# Patient Record
Sex: Female | Born: 2000 | Race: White | Hispanic: No | Marital: Single | Smoking: Never smoker
Health system: Southern US, Community
[De-identification: ages and names within clinical notes are randomized; demographics above are authoritative.]

## PROBLEM LIST (undated history)

## (undated) DIAGNOSIS — F32A Depression, unspecified: Secondary | ICD-10-CM

## (undated) DIAGNOSIS — F411 Generalized anxiety disorder: Secondary | ICD-10-CM

## (undated) DIAGNOSIS — D649 Anemia, unspecified: Secondary | ICD-10-CM

## (undated) DIAGNOSIS — Z7689 Persons encountering health services in other specified circumstances: Secondary | ICD-10-CM

## (undated) HISTORY — DX: Generalized anxiety disorder: F41.1

## (undated) HISTORY — DX: Depression, unspecified: F32.A

## (undated) HISTORY — DX: Persons encountering health services in other specified circumstances: Z76.89

## (undated) HISTORY — DX: Anemia, unspecified: D64.9

---
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
## 2019-09-03 ENCOUNTER — Ambulatory Visit (INDEPENDENT_AMBULATORY_CARE_PROVIDER_SITE_OTHER): Payer: No Typology Code available for payment source | Admitting: Family Medicine

## 2019-09-03 ENCOUNTER — Other Ambulatory Visit: Payer: Self-pay

## 2019-09-03 VITALS — BP 122/76 | Temp 98.5°F | Ht 66.0 in | Wt 120.8 lb

## 2019-09-03 DIAGNOSIS — Z0000 Encounter for general adult medical examination without abnormal findings: Secondary | ICD-10-CM | POA: Diagnosis not present

## 2019-09-03 DIAGNOSIS — F321 Major depressive disorder, single episode, moderate: Secondary | ICD-10-CM | POA: Diagnosis not present

## 2019-09-03 DIAGNOSIS — F411 Generalized anxiety disorder: Secondary | ICD-10-CM

## 2019-09-03 MED ORDER — ESCITALOPRAM OXALATE 10 MG PO TABS: 10.0000 mg | ORAL_TABLET | Freq: Every day | ORAL | 2 refills | Status: DC

## 2019-09-03 NOTE — Patient Instructions (Signed)
Persistent Depressive Disorder  Persistent depressive disorder (PDD) is a mental health condition. PDD causes symptoms of low-level depression for 2 years or longer. It may also be called long-term (chronic) depression or dysthymia. PDD may include episodes of more severe depression that last for about 2 weeks (major depressive disorder or MDD). PDD can affect the way you think, feel, and sleep. This condition may also affect your relationships. You may be more likely to get sick if you have PDD. Symptoms of PDD occur for most of the day and may include:  Feeling tired (fatigue).  Low energy.  Eating too much or too little.  Sleeping too much or too little.  Feeling restless or agitated.  Feeling hopeless.  Feeling worthless or guilty.  Feeling worried or nervous (anxiety).  Trouble concentrating or making decisions.  Low self-esteem.  A negative way of looking at things (outlook).  Not being able to have fun or feel pleasure.  Avoiding interacting with people.  Getting angry or annoyed easily (irritability).  Acting aggressive or angry. Follow these instructions at home: Activity  Go back to your normal activities as told by your doctor.  Exercise regularly as told by your doctor. General instructions  Take over-the-counter and prescription medicines only as told by your doctor.  Do not drink alcohol. Or, limit how much alcohol you drink to no more than 1 drink a day for nonpregnant women and 2 drinks a day for men. One drink equals 12 oz of beer, 5 oz of wine, or 1 oz of hard liquor. Alcohol can affect any antidepressant medicines you are taking. Talk with your doctor about your alcohol use.  Eat a healthy diet and get plenty of sleep.  Find activities that you enjoy each day.  Consider joining a support group. Your doctor may be able to suggest a support group.  Keep all follow-up visits as told by your doctor. This is important. Where to find more  information [employer] on Mental Illness  www.nami.org [place] National Institute of Mental Health  https://carter.com/ National Suicide Prevention Lifeline  478 293 7458).  This is free, 24-hour help. Contact a doctor if:  Your symptoms get worse.  You have new symptoms.  You have trouble sleeping or doing your daily activities. Get help right away if:  You self-harm.  You have serious thoughts about hurting yourself or others.  You see, hear, taste, smell, or feel things that are not there (hallucinate). This information is not intended to replace advice given to you by your health care provider. Make sure you discuss any questions you have with your health care provider. Document Revised: 07/21/2017 Document Reviewed: 04/01/2016 Elsevier Patient Education  [place]. Generalized Anxiety Disorder, Adult Generalized anxiety disorder (GAD) is a mental health disorder. People with this condition constantly worry about everyday events. Unlike normal anxiety, worry related to GAD is not triggered by a specific event. These worries also do not fade or get better with time. GAD interferes with life functions, including relationships, work, and school. GAD can vary from mild to severe. People with severe GAD can have intense waves of anxiety with physical symptoms (panic attacks). What are the causes? The exact cause of GAD is not known. What increases the risk? This condition is more likely to develop in:  Women.  People who have a family history of anxiety disorders.  People who are very shy.  People who experience very stressful life events, such as the death of a loved one.  People  who have a very stressful family environment. What are the signs or symptoms? People with GAD often worry excessively about many things in their lives, such as their health and family. They may also be overly concerned about:  Doing well at work.  Being on time.  Natural  disasters.  Friendships. Physical symptoms of GAD include:  Fatigue.  Muscle tension or having muscle twitches.  Trembling or feeling shaky.  Being easily startled.  Feeling like your heart is pounding or racing.  Feeling out of breath or like you cannot take a deep breath.  Having trouble falling asleep or staying asleep.  Sweating.  Nausea, diarrhea, or irritable bowel syndrome (IBS).  Headaches.  Trouble concentrating or remembering facts.  Restlessness.  Irritability. How is this diagnosed? Your health care provider can diagnose GAD based on your symptoms and medical history. You will also have a physical exam. The health care provider will ask specific questions about your symptoms, including how severe they are, when they started, and if they come and go. Your health care provider may ask you about your use of alcohol or drugs, including prescription medicines. Your health care provider may refer you to a mental health specialist for further evaluation. Your health care provider will do a thorough examination and may perform additional tests to rule out other possible causes of your symptoms. To be diagnosed with GAD, a person must have anxiety that:  Is out of his or her control.  Affects several different aspects of his or her life, such as work and relationships.  Causes distress that makes him or her unable to take part in normal activities.  Includes at least three physical symptoms of GAD, such as restlessness, fatigue, trouble concentrating, irritability, muscle tension, or sleep problems. Before your health care provider can confirm a diagnosis of GAD, these symptoms must be present more days than they are not, and they must last for six months or longer. How is this treated? The following therapies are usually used to treat GAD:  Medicine. Antidepressant medicine is usually prescribed for long-term daily control. Antianxiety medicines may be added in  severe cases, especially when panic attacks occur.  Talk therapy (psychotherapy). Certain types of talk therapy can be helpful in treating GAD by providing support, education, and guidance. Options include: ? Cognitive behavioral therapy (CBT). People learn coping skills and techniques to ease their anxiety. They learn to identify unrealistic or negative thoughts and behaviors and to replace them with positive ones. ? Acceptance and commitment therapy (ACT). This treatment teaches people how to be mindful as a way to cope with unwanted thoughts and feelings. ? Biofeedback. This process trains you to manage your body's response (physiological response) through breathing techniques and relaxation methods. You will work with a therapist while machines are used to monitor your physical symptoms.  Stress management techniques. These include yoga, meditation, and exercise. A mental health specialist can help determine which treatment is best for you. Some people see improvement with one type of therapy. However, other people require a combination of therapies. Follow these instructions at home:  Take over-the-counter and prescription medicines only as told by your health care provider.  Try to maintain a normal routine.  Try to anticipate stressful situations and allow extra time to manage them.  Practice any stress management or self-calming techniques as taught by your health care provider.  Do not punish yourself for setbacks or for not making progress.  Try to recognize your accomplishments, even if they  are small.  Keep all follow-up visits as told by your health care provider. This is important. Contact a health care provider if:  Your symptoms do not get better.  Your symptoms get worse.  You have signs of depression, such as: ? A persistently sad, cranky, or irritable mood. ? Loss of enjoyment in activities that used to bring you joy. ? Change in weight or eating. ? Changes in  sleeping habits. ? Avoiding friends or family members. ? Loss of energy for normal tasks. ? Feelings of guilt or worthlessness. Get help right away if:  You have serious thoughts about hurting yourself or others. If you ever feel like you may hurt yourself or others, or have thoughts about taking your own life, get help right away. You can go to your nearest emergency department or call:  Your local emergency services (911 in the [place]  A suicide crisis helpline, such as the National Suicide Prevention Lifeline at 703-189-4594. This is open 24 hours a day. Summary  Generalized anxiety disorder (GAD) is a mental health disorder that involves worry that is not triggered by a specific event.  People with GAD often worry excessively about many things in their lives, such as their health and family.  GAD may cause physical symptoms such as restlessness, trouble concentrating, sleep problems, frequent sweating, nausea, diarrhea, headaches, and trembling or muscle twitching.  A mental health specialist can help determine which treatment is best for you. Some people see improvement with one type of therapy. However, other people require a combination of therapies. This information is not intended to replace advice given to you by your health care provider. Make sure you discuss any questions you have with your health care provider. Document Revised: 07/21/2017 Document Reviewed: 06/28/2016 Elsevier Patient Education  2020 [employer].

## 2019-09-03 NOTE — Progress Notes (Signed)
Subjective:    Patient ID: Melissa Santana, female    DOB: 04/27/2001, 19 y.o.   MRN: [id number]  HPI   The patient comes in today for a wellness visit.    A review of their health history was completed.  A review of medications was also completed.  Any needed refills; none  Eating habits: healthy diet  Falls/  MVA accidents in past few months: none  Regular exercise: everyday  Specialist pt sees on regular basis: mental health therapist  Preventative health issues were discussed.   Additional concerns: Patient would like to start medications for depression and Anxiety- GAD 7 and PHQ 9 given to patient. Patient states the mental health facility where she receives her therapy could not get her in till the end of February for medications.   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  HENT: Negative for congestion and rhinorrhea.   Eyes: Negative for discharge.  Respiratory: Negative for cough, chest tightness and wheezing.   Cardiovascular: Negative for chest pain.  Gastrointestinal: Negative for abdominal pain, blood in stool and vomiting.  Endocrine: Negative for polyphagia.  Genitourinary: Negative for difficulty urinating and frequency.  Musculoskeletal: Negative for neck pain.  Skin: Negative for color change.    Allergic/Immunologic: Negative for environmental allergies and food allergies.  Neurological: Negative for weakness and headaches.  Psychiatric/Behavioral: Negative for agitation and behavioral problems.  All other systems reviewed and are negative.      Objective:   Physical Exam Vitals reviewed.  Constitutional:      Appearance: She is well-developed.  HENT:     Head: Normocephalic.     Right Ear: External ear normal.     Left Ear: External ear normal.  Eyes:     Pupils: Pupils are equal, round, and reactive to light.  Neck:     Thyroid: No thyromegaly.  Cardiovascular:     Rate and Rhythm: Normal rate and regular rhythm.     Heart sounds: Normal heart sounds. No murmur.  Pulmonary:     Effort: Pulmonary effort is normal. No respiratory distress.     Breath sounds: Normal breath sounds. No wheezing.  Abdominal:     General: Bowel sounds are normal. There is no distension.     Palpations: Abdomen is soft. There is no mass.     Tenderness: There is no abdominal tenderness.  Musculoskeletal:        General: No tenderness. Normal range of motion.     Cervical back: Normal range of motion.  Lymphadenopathy:     Cervical: No cervical adenopathy.  Skin:    General: Skin is warm and dry.  Neurological:     Mental Status: She  is alert and oriented to person, place, and time.     Motor: No abnormal muscle tone.  Psychiatric:        Behavior: Behavior normal.           Assessment & Plan:  Impression 1 diet discussed.  Exercise discussed.  Vaccines discussed.   2.  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  Patient has now suicidal ideation.  She has had these challenges off and on literally for years but seem to be worse in recent months.  She is interested in taking medication.  Long discussion held regarding pros and cons and benefits and potential side effects including even worsening.  Patient cautioned to notify us immediately if the  medication seems to make things worse.  We will press on with Lexapro 10 mg daily rationale discussed follow-up in a couple months.  Educational information given call us in several weeks with status

## 2019-09-18 ENCOUNTER — Encounter: Payer: Self-pay | Admitting: Family Medicine

## 2019-09-19 ENCOUNTER — Encounter: Payer: Self-pay | Admitting: Family Medicine

## 2019-11-04 ENCOUNTER — Encounter: Payer: Self-pay | Admitting: Family Medicine

## 2019-11-04 ENCOUNTER — Ambulatory Visit (INDEPENDENT_AMBULATORY_CARE_PROVIDER_SITE_OTHER): Payer: No Typology Code available for payment source | Admitting: Family Medicine

## 2019-11-04 ENCOUNTER — Other Ambulatory Visit: Payer: Self-pay

## 2019-11-04 VITALS — BP 118/76 | Temp 97.1°F | Wt 120.0 lb

## 2019-11-04 DIAGNOSIS — F321 Major depressive disorder, single episode, moderate: Secondary | ICD-10-CM

## 2019-11-04 DIAGNOSIS — F411 Generalized anxiety disorder: Secondary | ICD-10-CM

## 2019-11-04 MED ORDER — ESCITALOPRAM OXALATE 10 MG PO TABS: 10.0000 mg | ORAL_TABLET | Freq: Every day | ORAL | 5 refills | Status: DC

## 2019-11-04 NOTE — Progress Notes (Signed)
   Subjective:    Patient ID: Melissa Santana, female    DOB: Nov 05, 2000, 19 y.o.   MRN: [id number]  HPI  Patient arrives for a follow up on depression. Patient states she is doing well on the Lexapro with no problems or concerns.  Patient states Lexapro handling very well.  No obvious side effects.  Within several weeks after initiating medication no tremendous improvement.  Still receiving counseling definitely helping.  No suicidal or homicidal thoughts  Excited about going to college going forward Review of Systems See above    Objective:   Physical Exam  Alert vitals stable, NAD. Blood pressure good on repeat. HEENT normal. Lungs clear. Heart regular rate and rhythm.       Assessment & Plan:  Impression depression moderate stable.  With element of anxiety disorder under good control discussed to maintain Lexapro 6 months worth.  Follow-up plan.  Exercise encouraged.  Ongoing counseling follow-up in 6 months.  Patient may well benefit from long-term medication with strong family history on both sides and personal history of these tendencies

## 2019-12-02 ENCOUNTER — Other Ambulatory Visit: Payer: Self-pay | Admitting: Family Medicine

## 2019-12-19 DIAGNOSIS — H5213 Myopia, bilateral: Secondary | ICD-10-CM | POA: Diagnosis not present

## 2020-04-11 ENCOUNTER — Ambulatory Visit
Admission: EM | Admit: 2020-04-11 | Discharge: 2020-04-11 | Disposition: A | Discharge status: Home / Self Care | Payer: 59 | Attending: Family Medicine | Admitting: Family Medicine

## 2020-04-11 ENCOUNTER — Other Ambulatory Visit: Payer: Self-pay

## 2020-04-11 DIAGNOSIS — R5383 Other fatigue: Secondary | ICD-10-CM

## 2020-04-11 MED ORDER — ONDANSETRON 4 MG PO TBDP: 4.0000 mg | ORAL_TABLET | Freq: Three times a day (TID) | ORAL | 0 refills | Status: DC | PRN

## 2020-04-11 NOTE — ED Triage Notes (Signed)
Pt presents with increased fatigue for about 3 days, has h/o anemia

## 2020-04-11 NOTE — Discharge Instructions (Signed)
Zofran as needed for nausea, vomiting.  Try to make sure your intake of fluids to include Gatorade with electrolytes for rehydration. Covid swab testing done and testing for anemia will call you with any positive or abnormal results. Rest and go to the ER for worsening symptoms

## 2020-04-12 LAB — CBC
Hematocrit: 40.9 % (ref 34.0–46.6)
Hemoglobin: 13.2 g/dL (ref 11.1–15.9)
MCH: 28.3 pg (ref 26.6–33.0)
MCHC: 32.3 g/dL (ref 31.5–35.7)
MCV: 88 fL (ref 79–97)
Platelets: 258 10*3/uL (ref 150–450)
RBC: 4.67 x10E6/uL (ref 3.77–5.28)
RDW: 12.9 % (ref 11.7–15.4)
WBC: 7.5 10*3/uL (ref 3.4–10.8)

## 2020-04-12 LAB — NOVEL CORONAVIRUS, NAA: SARS-CoV-2, NAA: NOT DETECTED

## 2020-04-12 LAB — SARS-COV-2, NAA 2 DAY TAT

## 2020-04-14 NOTE — ED Provider Notes (Signed)
[facility]    CSN: [id number] Arrival date & time: 04/11/20  1505      History   Chief Complaint Chief Complaint  Patient presents with  . Fatigue    HPI Melissa Santana is a 19 y.o. female.   Patient is a 19 year old female presents today with fatigue and just not feeling well over the last 3 days.  History of anemia.  She has had mild headache.  No cough, chest congestion, sore throat, ear pain, fever, chills, body aches.  Having regular menstrual cycles.  Menstrual cycles are always very heavy.  Last one was 03/29/2020.  No abdominal pain, vomiting or diarrhea.  Mild nausea     Past Medical History:  Diagnosis Date  . Anemia   . Menstrual extraction 06/12/2013    Patient Active Problem List   Diagnosis Date Noted  . 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 Menorrhagia 06/12/2013  . Anemia 06/12/2013  . Menstrual extraction 06/12/2013    History reviewed. No pertinent surgical history.  OB History   No obstetric history on file.      Home Medications    Prior to Admission medications   Medication Sig Start Date End Date Taking? Authorizing Provider  escitalopram (LEXAPRO) 10 MG tablet Take 1 tablet by mouth once daily 12/02/19   Merlyn Albert, MD  ondansetron (ZOFRAN ODT) 4 MG disintegrating tablet Take 1 tablet (4 mg total) by mouth every 8 (eight) hours as needed for nausea or vomiting. 04/11/20   Janace Aris, NP    Family History History reviewed. No pertinent family history.  Social History Social History   Tobacco Use  . Smoking status: Never Smoker  . Smokeless tobacco: Never Used  Substance Use Topics  . Alcohol use: No  . Drug use: No     Allergies   Patient has no known allergies.   Review of Systems Review of Systems   Physical Exam Triage Vital Signs ED Triage Vitals  Enc Vitals Group     BP 04/11/20 1548 110/74     Pulse Rate 04/11/20 1548 95     Resp 04/11/20 1548 18     Temp 04/11/20 1548 98.4 F (36.9 C)       Temp src --      SpO2 04/11/20 1548 98 %     Weight --      Height --      Head Circumference --      Peak Flow --      Pain Score 04/11/20 1547 0     Pain Loc --      Pain Edu? --      Excl. in GC? --    No data found.  Updated Vital Signs BP 110/74   Pulse 95   Temp 98.4 F (36.9 C)   Resp 18   LMP 03/29/2020   SpO2 98%   Visual Acuity Right Eye Distance:   Left Eye Distance:   Bilateral Distance:    Right Eye Near:   Left Eye Near:    Bilateral Near:     Physical Exam Vitals and nursing note reviewed.  Constitutional:      General: She is not in acute distress.    Appearance: Normal appearance. She is ill-appearing. She is not toxic-appearing or diaphoretic.  HENT:     Head: Normocephalic.     Nose: Nose normal.     Mouth/Throat:     Pharynx: Oropharynx is clear.  Eyes:  Conjunctiva/sclera: Conjunctivae normal.  Cardiovascular:     Rate and Rhythm: Normal rate and regular rhythm.  Pulmonary:     Effort: Pulmonary effort is normal.  Musculoskeletal:        General: Normal range of motion.     Cervical back: Normal range of motion.  Skin:    General: Skin is warm and dry.     Findings: No rash.  Neurological:     Mental Status: She is alert.  Psychiatric:        Mood and Affect: Mood normal.      UC Treatments / Results  Labs (all labs ordered are listed, but only abnormal results are displayed) Labs Reviewed  NOVEL CORONAVIRUS, NAA   Narrative:    Performed at:  [address], [place], [place]  [id number] Lab Director: Jolene Schimke MD, Phone:  [phone]  SARS-COV-2, NAA 2 DAY TAT   Narrative:    Performed at:  [address], [place], [place]  [id number] Lab Director: Jolene Schimke MD, Phone:  [phone]  CBC   Narrative:    Performed at:  [address], [place], [place]  [id number] Lab Director: Jolene Schimke MD, Phone:  [phone]     EKG   Radiology No results found.  Procedures Procedures (including critical care time)  Medications Ordered in UC Medications - No data to display  Initial Impression / Assessment and Plan / UC Course  I have reviewed the triage vital signs and the nursing notes.  Pertinent labs & imaging results that were available during my care of the patient were reviewed by me and considered in my medical decision making (see chart for details).     Fatigue Some concern for anemia at this time.  Will check CBC. Otherwise Ms. likely some sort of viral illness.  Covid swab pending.  Zofran as needed for nausea, vomiting. Recommend increase fluids to include Gatorade with electrolytes for rehydration. Rest and go to the ER for worsening symptoms  CBC normal Covid swab negative Final Clinical Impressions(s) / UC Diagnoses   Final diagnoses:  Fatigue, unspecified type     Discharge Instructions     Zofran as needed for nausea, vomiting.  Try to make sure your intake of fluids to include Gatorade with electrolytes for rehydration. Covid swab testing done and testing for anemia will call you with any positive or abnormal results. Rest and go to the ER for worsening symptoms    ED Prescriptions    Medication Sig Dispense Auth. Provider   ondansetron (ZOFRAN ODT) 4 MG disintegrating tablet Take 1 tablet (4 mg total) by mouth every 8 (eight) hours as needed for nausea or vomiting. 20 tablet Dahlia Byes A, NP     PDMP not reviewed this encounter.   Janace Aris, NP 04/14/20 [phone]

## 2020-04-20 ENCOUNTER — Encounter: Payer: Self-pay | Admitting: Family Medicine

## 2020-04-20 ENCOUNTER — Ambulatory Visit (INDEPENDENT_AMBULATORY_CARE_PROVIDER_SITE_OTHER): Payer: Self-pay | Admitting: Family Medicine

## 2020-04-20 ENCOUNTER — Other Ambulatory Visit: Payer: Self-pay

## 2020-04-20 VITALS — BP 102/72 | HR 115 | Temp 97.3°F | Ht 66.0 in | Wt 120.4 lb

## 2020-04-20 DIAGNOSIS — R1032 Left lower quadrant pain: Secondary | ICD-10-CM

## 2020-04-20 DIAGNOSIS — F321 Major depressive disorder, single episode, moderate: Secondary | ICD-10-CM | POA: Insufficient documentation

## 2020-04-20 MED ORDER — ESCITALOPRAM OXALATE 10 MG PO TABS: ORAL_TABLET | ORAL | 1 refills | Status: DC

## 2020-04-20 NOTE — Progress Notes (Signed)
Patient ID: Melissa Santana, female    DOB: 08-24-2000, 19 y.o.   MRN: [id number]   No chief complaint on file.  Subjective:    HPI Pt having abdominal pain. Not sure if Lexapro is causing abdominal pain. No appetite. Feeling fatigued and out of it. Pt states not sure if mental health could be contributing to issue. Pt also has had some bouts of constipation. Nothing taken for abdominal pain or constipation.     Medical History Melissa Santana has a past medical history of Anemia and Menstrual extraction (06/12/2013).   Outpatient Encounter Medications as of 04/20/2020  Medication Sig  . ondansetron (ZOFRAN ODT) 4 MG disintegrating tablet Take 1 tablet (4 mg total) by mouth every 8 (eight) hours as needed for nausea or vomiting.  . [DISCONTINUED] escitalopram (LEXAPRO) 10 MG tablet Take 1 tablet by mouth once daily  . escitalopram (LEXAPRO) 10 MG tablet Take 1.5 tablets daily   No facility-administered encounter medications on file as of 04/20/2020.     Review of Systems  Constitutional: Positive for appetite change. Negative for chills and fever.  HENT: Negative.   Eyes: Negative.   Respiratory: Negative.   Cardiovascular: Negative.   Gastrointestinal: Negative.   Endocrine: Negative.   Genitourinary: Negative.   Neurological: Negative.   Hematological: Negative.   Psychiatric/Behavioral: Negative for suicidal ideas.       Wonders if abdominal pain is related to depression.     Vitals BP 102/72   Pulse (!) 115   Temp (!) 97.3 F (36.3 C)   Ht 5\' 6"  (1.676 m)   Wt 120 lb 6.4 oz (54.6 kg)   LMP 03/29/2020   SpO2 99%   BMI 19.43 kg/m   Objective:   Physical Exam Vitals and nursing note reviewed.  Constitutional:      General: She is not in acute distress. Cardiovascular:     Rate and Rhythm: Normal rate and regular rhythm.     Heart sounds: Normal heart sounds.  Pulmonary:     Effort: Pulmonary effort is normal.     Breath sounds: Normal breath sounds.    Abdominal:     General: Abdomen is flat. Bowel sounds are normal.     Tenderness: There is abdominal tenderness.     Comments: Tenderness in LUQ and LLQ  Skin:    General: Skin is warm and dry.  Neurological:     Mental Status: She is alert and oriented to person, place, and time.  Psychiatric:        Mood and Affect: Mood normal.        Behavior: Behavior normal.     Comments: Denies suicidal ideations.       Assessment and Plan   1. Depression, major, single episode, moderate (HCC) - escitalopram (LEXAPRO) 10 MG tablet; Take 1.5 tablets daily  Dispense: 45 tablet; Refill: 1  2. Left lower quadrant abdominal pain - Ambulatory referral to Gastroenterology - 05/29/2020 Abdomen Complete   Melissa Santana presents today with c/o of generalized abdominal pain, decreased appetite, mental fogginess,nausea, loose stools that are less frequent that normal. This is not a new problem and wonders if it could be related to depression. Her PHQ-9: 14 today. I will increase her Lexapro to 15 mg and reassess for effectiveness in one month.  Upon physical exam, she did have some abdominal tenderness. Due to this finding, I ordered abdominal ultrasound and GI specialist referral. She does endorse a history of heavy menses. GYN referral may be needed.  Will notify her once ultrasound results are back and we can discuss next steps.  Agrees with plan of care discussed today. Understands warning signs to seek further care: fever, increased abdominal pain, suicidal thoughts.  Understands to follow-up in one month, sooner if anything changes.   She denies sexual activity currently or in the past.    Melissa Olive, NP 04/20/2020

## 2020-04-20 NOTE — Patient Instructions (Signed)

## 2020-04-21 ENCOUNTER — Ambulatory Visit (HOSPITAL_COMMUNITY)
Admission: RE | Admit: 2020-04-21 | Discharge: 2020-04-21 | Disposition: A | Discharge status: Home / Self Care | Payer: Self-pay | Source: Ambulatory Visit | Attending: Family Medicine | Admitting: Family Medicine

## 2020-04-21 DIAGNOSIS — R1032 Left lower quadrant pain: Secondary | ICD-10-CM | POA: Insufficient documentation

## 2020-04-22 ENCOUNTER — Encounter: Payer: Self-pay | Admitting: Internal Medicine

## 2020-04-23 ENCOUNTER — Telehealth: Payer: Self-pay | Admitting: Family Medicine

## 2020-04-23 NOTE — Telephone Encounter (Signed)
Left message to return call with pt to get more information

## 2020-04-23 NOTE — Telephone Encounter (Signed)
Pt is still having problems. Grandmother feels mental part is what bothers her. She is feeling out of it, dry mouth , loss of appetite, depression and anxiety, dizziness foggy memory. Grandmother feels it may be her meds. Pt is no longer in therapy each visit she was getting same advice and felt it not being beneficial.   Pt call back [phone]

## 2020-04-29 NOTE — Progress Notes (Signed)
Left message to return call 

## 2020-05-07 ENCOUNTER — Other Ambulatory Visit: Payer: Self-pay

## 2020-05-08 ENCOUNTER — Other Ambulatory Visit: Payer: Self-pay

## 2020-05-08 ENCOUNTER — Encounter: Payer: Self-pay | Admitting: Family Medicine

## 2020-05-08 ENCOUNTER — Ambulatory Visit: Payer: Self-pay | Admitting: Family Medicine

## 2020-05-08 ENCOUNTER — Telehealth: Payer: Self-pay | Admitting: *Deleted

## 2020-05-08 ENCOUNTER — Telehealth (INDEPENDENT_AMBULATORY_CARE_PROVIDER_SITE_OTHER): Payer: Self-pay | Admitting: Family Medicine

## 2020-05-08 DIAGNOSIS — F411 Generalized anxiety disorder: Secondary | ICD-10-CM | POA: Insufficient documentation

## 2020-05-08 DIAGNOSIS — F321 Major depressive disorder, single episode, moderate: Secondary | ICD-10-CM

## 2020-05-08 DIAGNOSIS — Z20822 Contact with and (suspected) exposure to covid-19: Secondary | ICD-10-CM

## 2020-05-08 MED ORDER — SERTRALINE HCL 50 MG PO TABS: 50.0000 mg | ORAL_TABLET | Freq: Every day | ORAL | 1 refills | Status: DC

## 2020-05-08 NOTE — Telephone Encounter (Signed)
Ms. zanovia, rotz are scheduled for a virtual visit with your provider today.    Just as we do with appointments in the office, we must obtain your consent to participate.  Your consent will be active for this visit and any virtual visit you may have with one of our providers in the next 365 days.    If you have a MyChart account, I can also send a copy of this consent to you electronically.  All virtual visits are billed to your insurance company just like a traditional visit in the office.  As this is a virtual visit, video technology does not allow for your provider to perform a traditional examination.  This may limit your provider's ability to fully assess your condition.  If your provider identifies any concerns that need to be evaluated in person or the need to arrange testing such as labs, EKG, etc, we will make arrangements to do so.    Although advances in technology are sophisticated, we cannot ensure that it will always work on either your end or our end.  If the connection with a video visit is poor, we may have to switch to a telephone visit.  With either a video or telephone visit, we are not always able to ensure that we have a secure connection.   I need to obtain your verbal consent now.   Are you willing to proceed with your visit today?   Marketta ALIANIS TRIMMER has provided verbal consent on 05/08/2020 for a virtual visit (video or telephone).   Haze Rushing, LPN 0/04/3817  2:99 PM

## 2020-05-08 NOTE — Progress Notes (Signed)
   Patient ID: Melissa Santana, female    DOB: 12/20/2000, 19 y.o.   MRN: [id number]   Chief Complaint  Patient presents with  . Depression    Patient reports Lexapro not working as well as it use to.    Subjective:  CC: follow-up for depression and anxiety.  HPI Saw Melissa Santana on 8/30 and Lexapro increased due to not feeling it was working as well. Reports she still does not feel better and wishes to try a different medication. Denies thoughts of harming self or others.   Medical History Melissa Santana has a past medical history of Anemia and Menstrual extraction (06/12/2013).   Outpatient Encounter Medications as of 05/08/2020  Medication Sig  . sertraline (ZOLOFT) 50 MG tablet Take 1 tablet (50 mg total) by mouth daily.  . [DISCONTINUED] escitalopram (LEXAPRO) 10 MG tablet Take 1.5 tablets daily  . [DISCONTINUED] ondansetron (ZOFRAN ODT) 4 MG disintegrating tablet Take 1 tablet (4 mg total) by mouth every 8 (eight) hours as needed for nausea or vomiting.   No facility-administered encounter medications on file as of 05/08/2020.     Review of Systems  Psychiatric/Behavioral: Negative for self-injury and suicidal ideas.       Feels like Lexapro is no longer working.      Vitals There were no vitals taken for this visit. unable to perform. This is a phone visit.   Objective:   Physical Exam Unable to preform.   Assessment and Plan   1. Depression, major, single episode, moderate (HCC) - sertraline (ZOLOFT) 50 MG tablet; Take 1 tablet (50 mg total) by mouth daily.  Dispense: 30 tablet; Refill: 1  2. Generalized anxiety disorder - sertraline (ZOLOFT) 50 MG tablet; Take 1 tablet (50 mg total) by mouth daily.  Dispense: 30 tablet; Refill: 1   Increased Melissa Santana's Lexapro on 8/30. She reports it still is not working as well for her as in the past. She wishes to try a different drug. She will start Sertraline 50 mg today and follow-up in 6 weeks, sooner if needed.  Agrees with plan  of care discussed today. Understands warning signs to seek further care: feelings or thoughts of harming self or others. If the change in therapy is not working at all.  Understands to follow-up in 6 weeks, sooner if needed. She understands.        Virtual Visit via Video Note  I connected with Melissa Santana on 05/08/20 at  1:30 PM EDT by a video enabled telemedicine application and verified that I am speaking with the correct person using two identifiers.  Location: Patient: home Provider: office    I discussed the limitations of evaluation and management by telemedicine and the availability of in person appointments. The patient expressed understanding and agreed to proceed.  History of Present Illness:    Observations/Objective:   Assessment and Plan:   Follow Up Instructions:    I discussed the assessment and treatment plan with the patient. The patient was provided an opportunity to ask questions and all were answered. The patient agreed with the plan and demonstrated an understanding of the instructions.   The patient was advised to call back or seek an in-person evaluation if the symptoms worsen or if the condition fails to improve as anticipated.  I provided 15 minutes of non-face-to-face time during this encounter.

## 2020-05-11 LAB — NOVEL CORONAVIRUS, NAA: SARS-CoV-2, NAA: NOT DETECTED

## 2020-05-20 ENCOUNTER — Ambulatory Visit: Payer: Self-pay | Admitting: Family Medicine

## 2020-06-05 ENCOUNTER — Ambulatory Visit: Payer: Self-pay | Admitting: Nurse Practitioner

## 2020-06-11 ENCOUNTER — Encounter: Payer: Self-pay | Admitting: Gastroenterology

## 2020-06-11 NOTE — Progress Notes (Deleted)
Referring Provider: Novella Olive, NP Primary Care Physician:  Annalee Genta, DO Primary Gastroenterologist:  Dr. Jena Gauss  No chief complaint on file.   HPI:   Melissa Santana is a 19 y.o. female presenting today at the request of Novella Olive, NP for LLQ abdominal pain.   Office visit with PCP 04/20/2020 with chief complaint of abdominal pain.  Taking Lexapro for depression and was not sure if this was causing abdominal pain.  No appetite, felt fatigued, bouts of constipation.  Patient stated she was not sure if mental health could be contributing to symptoms.  Lexapro was increased from 10 to 15 mg.  She was scheduled for ultrasound of her abdomen and referred to GI.  Abdominal ultrasound 04/21/2020: 2 mm gallbladder polyp, otherwise unremarkable.   Televisit with PCP 05/08/2020 for ongoing depression with patient desiring to try different medication.  Lexapro was stopped.  She was started on Zoloft 50 mg daily.  Today:     Gallbladder polyp: [place] in 1 year.   Consider pelvic US first  Past Medical History:  Diagnosis Date  . Anemia   . Menstrual extraction 06/12/2013    No past surgical history on file.  Current Outpatient Medications  Medication Sig Dispense Refill  . sertraline (ZOLOFT) 50 MG tablet Take 1 tablet (50 mg total) by mouth daily. 30 tablet 1   No current facility-administered medications for this visit.    Allergies as of 06/12/2020  . (No Known Allergies)    No family history on file.  Social History   Socioeconomic History  . Marital status: Single    Spouse name: Not on file  . Number of children: Not on file  . Years of education: Not on file  . Highest education level: Not on file  Occupational History  . Not on file  Tobacco Use  . Smoking status: Never Smoker  . Smokeless tobacco: Never Used  Substance and Sexual Activity  . Alcohol use: No  . Drug use: No  . Sexual activity: Never  Other Topics Concern  . Not on file    Social History Narrative  . Not on file   Social Determinants of Health   Financial Resource Strain:   . Difficulty of Paying Living Expenses: Not on file  Food Insecurity:   . Worried About Programme researcher, broadcasting/film/video in the Last Year: Not on file  . Ran Out of Food in the Last Year: Not on file  Transportation Needs:   . Lack of Transportation (Medical): Not on file  . Lack of Transportation (Non-Medical): Not on file  Physical Activity:   . Days of Exercise per Week: Not on file  . Minutes of Exercise per Session: Not on file  Stress:   . Feeling of Stress : Not on file  Social Connections:   . Frequency of Communication with Friends and Family: Not on file  . Frequency of Social Gatherings with Friends and Family: Not on file  . Attends Religious Services: Not on file  . Active Member of Clubs or Organizations: Not on file  . Attends Banker Meetings: Not on file  . Marital Status: Not on file  Intimate Partner Violence:   . Fear of Current or Ex-Partner: Not on file  . Emotionally Abused: Not on file  . Physically Abused: Not on file  . Sexually Abused: Not on file    Review of Systems: Gen: Denies any fever, chills, fatigue, weight loss, lack  of appetite.  CV: Denies chest pain, heart palpitations, peripheral edema, syncope.  Resp: Denies shortness of breath at rest or with exertion. Denies wheezing or cough.  GI: Denies dysphagia or odynophagia. Denies jaundice, hematemesis, fecal incontinence. GU : Denies urinary burning, urinary frequency, urinary hesitancy MS: Denies joint pain, muscle weakness, cramps, or limitation of movement.  Derm: Denies rash, itching, dry skin Psych: Denies depression, anxiety, memory loss, and confusion Heme: Denies bruising, bleeding, and enlarged lymph nodes.  Physical Exam: There were no vitals taken for this visit. General:   Alert and oriented. Pleasant and cooperative. Well-nourished and well-developed.  Head:   Normocephalic and atraumatic. Eyes:  Without icterus, sclera clear and conjunctiva pink.  Ears:  Normal auditory acuity. Nose:  No deformity, discharge,  or lesions. Mouth:  No deformity or lesions, oral mucosa pink.  Neck:  Supple, without mass or thyromegaly. Lungs:  Clear to auscultation bilaterally. No wheezes, rales, or rhonchi. No distress.  Heart:  S1, S2 present without murmurs appreciated.  Abdomen:  +BS, soft, non-tender and non-distended. No HSM noted. No guarding or rebound. No masses appreciated.  Rectal:  Deferred  Msk:  Symmetrical without gross deformities. Normal posture. Pulses:  Normal pulses noted. Extremities:  Without clubbing or edema. Neurologic:  Alert and  oriented x4;  grossly normal neurologically. Skin:  Intact without significant lesions or rashes. Cervical Nodes:  No significant cervical adenopathy. Psych:  Alert and cooperative. Normal mood and affect.

## 2020-06-12 ENCOUNTER — Ambulatory Visit: Payer: Self-pay | Admitting: Gastroenterology

## 2020-06-15 ENCOUNTER — Ambulatory Visit: Payer: Self-pay | Admitting: Family Medicine

## 2020-06-19 ENCOUNTER — Ambulatory Visit (INDEPENDENT_AMBULATORY_CARE_PROVIDER_SITE_OTHER): Payer: Self-pay | Admitting: Family Medicine

## 2020-06-19 ENCOUNTER — Other Ambulatory Visit: Payer: Self-pay

## 2020-06-19 ENCOUNTER — Encounter: Payer: Self-pay | Admitting: Family Medicine

## 2020-06-19 VITALS — BP 110/72 | HR 84 | Temp 97.3°F | Ht 66.0 in | Wt 120.0 lb

## 2020-06-19 DIAGNOSIS — F321 Major depressive disorder, single episode, moderate: Secondary | ICD-10-CM

## 2020-06-19 DIAGNOSIS — F411 Generalized anxiety disorder: Secondary | ICD-10-CM

## 2020-06-19 MED ORDER — HYDROXYZINE HCL 10 MG PO TABS: 10.0000 mg | ORAL_TABLET | Freq: Three times a day (TID) | ORAL | 0 refills | Status: DC | PRN

## 2020-06-19 MED ORDER — ESCITALOPRAM OXALATE 20 MG PO TABS: ORAL_TABLET | ORAL | 1 refills | Status: DC

## 2020-06-19 NOTE — Progress Notes (Signed)
Patient ID: Melissa Santana, female    DOB: 12/07/2000, 19 y.o.   MRN: [id number]   Chief Complaint  Patient presents with  . Depression   Subjective:    HPI  follow up on depression and anxiety. Pt states she stopped taking zoloft due to side effects.  Was on lexapro for 8 months.  Then went onto zoloft.  Side effects with zoloft, were fatigue and nausea. Pt stopped the medication a few wks ago. Pt didn't feel like helping with depression/anxiety.  Pt stating isn't sure if going back on lexapro. Before she was on medication felt anxious and had gi issues even before meds.  No concerns of self harm. High risk for GAD, family history of anxiety and depression. In btw jobs right now.  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  Dec appetite and things don't taste good. Intermittent with sleep difficulties.  occ hard to get to sleep.   Medical History Niya has a past medical history of Anemia, Depression, GAD (generalized anxiety disorder), and Menstrual extraction (06/12/2013).   Outpatient Encounter Medications as of 06/19/2020  Medication Sig  . escitalopram (LEXAPRO) 20 MG tablet Take 1/2 tab p.o. for 7 days, then increase to 1 tab daily.  . hydrOXYzine (ATARAX/VISTARIL) 10 MG tablet Take 1 tablet (10 mg total) by mouth 3 (three) times daily as needed for anxiety.  . [DISCONTINUED] sertraline (ZOLOFT) 50 MG tablet Take 1 tablet (50 mg total) by mouth daily.   No facility-administered encounter medications on file as of 06/19/2020.     Review of Systems  Constitutional: Negative for chills and fever.  HENT: Negative for congestion, rhinorrhea and sore throat.   Respiratory: Negative for cough, shortness of breath and wheezing.   Cardiovascular: Negative for chest pain and leg swelling.  Gastrointestinal: Negative for abdominal pain, diarrhea, nausea and vomiting.  Genitourinary: Negative for dysuria and frequency.  Musculoskeletal: Negative for  arthralgias and back pain.  Skin: Negative for rash.  Neurological: Negative for dizziness, weakness and headaches.  Psychiatric/Behavioral: Positive for dysphoric mood and sleep disturbance. Negative for agitation, behavioral problems, decreased concentration, self-injury and suicidal ideas. The patient is nervous/anxious.      Vitals BP 110/72   Pulse 84   Temp (!) 97.3 F (36.3 C)   Ht 5\' 6"  (1.676 m)   Wt 120 lb (54.4 kg)   SpO2 98%   BMI 19.37 kg/m   Objective:   Physical Exam Vitals and nursing note reviewed.  Constitutional:      General: She is not in acute distress.    Appearance: Normal appearance.  HENT:     Head: Normocephalic and atraumatic.  Cardiovascular:     Rate and Rhythm: Normal rate and regular rhythm.     Pulses: Normal pulses.     Heart sounds: Normal heart sounds.  Pulmonary:     Effort: Pulmonary effort is normal.     Breath sounds: Normal breath sounds. No wheezing, rhonchi or rales.  Musculoskeletal:        General: Normal range of motion.     Right lower leg: No edema.     Left lower leg: No edema.  Skin:    General: Skin is warm and dry.     Findings: No lesion or rash.  Neurological:     General: No focal deficit present.     Mental Status: She is alert and oriented to person, place, and time.     Cranial Nerves: No cranial nerve  deficit.  Psychiatric:        Behavior: Behavior normal.        Thought Content: Thought content normal.        Judgment: Judgment normal.     Comments: +anxious mood      Assessment and Plan   1. Depression, major, single episode, moderate (HCC) - escitalopram (LEXAPRO) 20 MG tablet; Take 1/2 tab p.o. for 7 days, then increase to 1 tab daily.  Dispense: 30 tablet; Refill: 1  2. Generalized anxiety disorder - escitalopram (LEXAPRO) 20 MG tablet; Take 1/2 tab p.o. for 7 days, then increase to 1 tab daily.  Dispense: 30 tablet; Refill: 1 - hydrOXYzine (ATARAX/VISTARIL) 10 MG tablet; Take 1 tablet (10 mg  total) by mouth 3 (three) times daily as needed for anxiety.  Dispense: 30 tablet; Refill: 0    Recommending starting with 10mg  lexapro, since had less side effects and felt this medication did help.  Then after 1 wk increase to 20mg  daily. Gave hydroxyzine for anxiety and insomnia prn. Pt to call or rto if having any concerns of SI/HI, or self harm or side effects.  Pt in agreement.  F/u 4 wks for recheck.

## 2020-06-28 ENCOUNTER — Encounter: Payer: Self-pay | Admitting: Family Medicine

## 2020-07-20 ENCOUNTER — Telehealth: Payer: Self-pay | Admitting: Family Medicine

## 2020-07-23 ENCOUNTER — Encounter: Payer: Self-pay | Admitting: Family Medicine

## 2020-07-23 ENCOUNTER — Telehealth (INDEPENDENT_AMBULATORY_CARE_PROVIDER_SITE_OTHER): Payer: Self-pay | Admitting: Family Medicine

## 2020-07-23 ENCOUNTER — Other Ambulatory Visit: Payer: Self-pay

## 2020-07-23 DIAGNOSIS — F411 Generalized anxiety disorder: Secondary | ICD-10-CM

## 2020-07-23 DIAGNOSIS — F321 Major depressive disorder, single episode, moderate: Secondary | ICD-10-CM

## 2020-07-23 MED ORDER — ESCITALOPRAM OXALATE 20 MG PO TABS: 20.0000 mg | ORAL_TABLET | Freq: Every day | ORAL | 2 refills | Status: DC

## 2020-07-23 MED ORDER — HYDROXYZINE HCL 10 MG PO TABS: 10.0000 mg | ORAL_TABLET | Freq: Three times a day (TID) | ORAL | 2 refills | Status: DC | PRN

## 2020-07-23 NOTE — Progress Notes (Signed)
Patient ID: Melissa Santana, female    DOB: 06/06/2001, 19 y.o.   MRN: [id number]  Virtual Visit via Telephone Note  I connected with Melissa Santana on 07/27/20 at  2:10 PM EST by telephone and verified that I am speaking with the correct person using two identifiers.  Location: Patient: home Provider: office   I discussed the limitations, risks, security and privacy concerns of performing an evaluation and management service by telephone and the availability of in person appointments. I also discussed with the patient that there may be a patient responsible charge related to this service. The patient expressed understanding and agreed to proceed.   Chief Complaint  Patient presents with  . Anxiety and depression    Patient reports improvement in symptoms since starting lexapro. Patient states she has been sleeping well and appetite has improved.   Subjective:    HPI  F/u depression/anxiety- Had a phone visit.  Pt doing well. And feeling a "huge" diffence with 20mg  lexapro.  Taking hydroxyzine for sleep working well.  No side effects.  Sleeping well and appetite has improved.   No gi upset, dizziness, or headaches.  Medical History Yarelli has a past medical history of Anemia, Depression, GAD (generalized anxiety disorder), and Menstrual extraction (06/12/2013).   Outpatient Encounter Medications as of 07/23/2020  Medication Sig  . escitalopram (LEXAPRO) 20 MG tablet Take 1 tablet (20 mg total) by mouth daily. Take 1/2 tab p.o. for 7 days, then increase to 1 tab daily.  . hydrOXYzine (ATARAX/VISTARIL) 10 MG tablet Take 1 tablet (10 mg total) by mouth 3 (three) times daily as needed for anxiety.  . [DISCONTINUED] escitalopram (LEXAPRO) 20 MG tablet Take 1/2 tab p.o. for 7 days, then increase to 1 tab daily.  . [DISCONTINUED] hydrOXYzine (ATARAX/VISTARIL) 10 MG tablet Take 1 tablet (10 mg total) by mouth 3 (three) times daily as needed for anxiety.   No  facility-administered encounter medications on file as of 07/23/2020.     Review of Systems  Constitutional: Negative for chills and fever.  HENT: Negative for congestion, rhinorrhea and sore throat.   Respiratory: Negative for cough, shortness of breath and wheezing.   Cardiovascular: Negative for chest pain and leg swelling.  Gastrointestinal: Negative for abdominal pain, diarrhea, nausea and vomiting.  Genitourinary: Negative for dysuria and frequency.  Musculoskeletal: Negative for arthralgias and back pain.  Skin: Negative for rash.  Neurological: Negative for dizziness, weakness and headaches.  Psychiatric/Behavioral: Negative for dysphoric mood, self-injury, sleep disturbance and suicidal ideas. The patient is not nervous/anxious.      Vitals There were no vitals taken for this visit.  Objective:   Physical Exam No PE due to phone visit.  Assessment and Plan   1. Depression, major, single episode, moderate (HCC) - escitalopram (LEXAPRO) 20 MG tablet; Take 1 tablet (20 mg total) by mouth daily. Take 1/2 tab p.o. for 7 days, then increase to 1 tab daily.  Dispense: 30 tablet; Refill: 2  2. Generalized anxiety disorder - escitalopram (LEXAPRO) 20 MG tablet; Take 1 tablet (20 mg total) by mouth daily. Take 1/2 tab p.o. for 7 days, then increase to 1 tab daily.  Dispense: 30 tablet; Refill: 2 - hydrOXYzine (ATARAX/VISTARIL) 10 MG tablet; Take 1 tablet (10 mg total) by mouth 3 (three) times daily as needed for anxiety.  Dispense: 60 tablet; Refill: 2    Will cont meds for 20mg  lexapro and hydroxyzine prn. Pt doing well and feeling better.   F/u 91mo.  Follow Up Instructions:    I discussed the assessment and treatment plan with the patient. The patient was provided an opportunity to ask questions and all were answered. The patient agreed with the plan and demonstrated an understanding of the instructions.   The patient was advised to call back or seek an in-person  evaluation if the symptoms worsen or if the condition fails to improve as anticipated.  I provided 11 minutes of non-face-to-face time during this encounter.

## 2020-09-22 DIAGNOSIS — Z419 Encounter for procedure for purposes other than remedying health state, unspecified: Secondary | ICD-10-CM | POA: Diagnosis not present

## 2020-10-06 ENCOUNTER — Other Ambulatory Visit: Payer: Self-pay

## 2020-10-13 ENCOUNTER — Other Ambulatory Visit: Payer: Medicaid Other

## 2020-10-16 ENCOUNTER — Other Ambulatory Visit: Payer: Self-pay

## 2020-10-16 ENCOUNTER — Ambulatory Visit (INDEPENDENT_AMBULATORY_CARE_PROVIDER_SITE_OTHER): Payer: Medicaid Other | Admitting: Family Medicine

## 2020-10-16 VITALS — BP 116/78 | HR 103 | Temp 97.7°F | Ht 66.0 in | Wt 127.2 lb

## 2020-10-16 DIAGNOSIS — F321 Major depressive disorder, single episode, moderate: Secondary | ICD-10-CM

## 2020-10-16 DIAGNOSIS — F411 Generalized anxiety disorder: Secondary | ICD-10-CM

## 2020-10-16 MED ORDER — ESCITALOPRAM OXALATE 10 MG PO TABS: ORAL_TABLET | ORAL | 0 refills | Status: DC

## 2020-10-16 MED ORDER — CLONAZEPAM 0.5 MG PO TABS: 0.5000 mg | ORAL_TABLET | Freq: Two times a day (BID) | ORAL | 0 refills | Status: DC | PRN

## 2020-10-16 MED ORDER — FLUOXETINE HCL 20 MG PO CAPS: 20.0000 mg | ORAL_CAPSULE | Freq: Every day | ORAL | 1 refills | Status: DC

## 2020-10-16 NOTE — Patient Instructions (Signed)
Cut lexapro in 1/2 for 7 days, then take 1/2 tablet every other day.   Then start prozac 20mg  daily.  Clonazepam take 1 tab 2x per day as needed for anxiety.

## 2020-10-16 NOTE — Progress Notes (Signed)
Patient ID: Melissa Santana, female    DOB: 04-03-2001, 19 y.o.   MRN: [id number]   Chief Complaint  Patient presents with  . Depression    Follow up  . Anxiety    Follow up   Subjective:   CC- "Haven't noticed a difference when taking the atarax and the lexapro is not completely working either."  HPI Was on 10mg  lexapro then went up to 20mg  lexapro.  Not noticing working as well.  Inc in October on the lexapro.  Helping at first then wearing off.   Tried hydroxyzine 10mg .  Didn't notice much.  Tried at night and during day.   Going to school full-time.  Applying from [employer] to [employer]. Psychology-major  Feels equally depression and anxiety not controlled.  Not tearful but having low energy, dec appetite, and not feeling pleasure if doing things.  Anxious and worrying frequently.    Medical History Melissa Santana has a past medical history of Anemia, Depression, GAD (generalized anxiety disorder), and Menstrual extraction (06/12/2013).   Outpatient Encounter Medications as of 10/16/2020  Medication Sig  . clonazePAM (KLONOPIN) 0.5 MG tablet Take 1 tablet (0.5 mg total) by mouth 2 (two) times daily as needed for anxiety.  escitalopram (LEXAPRO) 10 MG tablet Take 1/2 tab p.o. daily for 7 days, then take 1/2 tab every other day for 3 doses. Then discontinue.  06/14/2013 FLUoxetine (PROZAC) 20 MG capsule Take 1 capsule (20 mg total) by mouth daily.  . hydrOXYzine (ATARAX/VISTARIL) 10 MG tablet Take 1 tablet (10 mg total) by mouth 3 (three) times daily as needed for anxiety.  . [DISCONTINUED] escitalopram (LEXAPRO) 20 MG tablet Take 1 tablet (20 mg total) by mouth daily. Take 1/2 tab p.o. for 7 days, then increase to 1 tab daily.   No facility-administered encounter medications on file as of 10/16/2020.     Review of Systems  Constitutional: Negative for chills and fever.  HENT: Negative for congestion, rhinorrhea and sore throat.   Respiratory: Negative for cough, shortness of breath and  wheezing.   Cardiovascular: Negative for chest pain and leg swelling.  Gastrointestinal: Negative for abdominal pain, diarrhea, nausea and vomiting.  Genitourinary: Negative for dysuria and frequency.  Musculoskeletal: Negative for arthralgias and back pain.  Skin: Negative for rash.  Neurological: Negative for dizziness, weakness and headaches.  Psychiatric/Behavioral: Positive for dysphoric mood. Negative for self-injury, sleep disturbance and suicidal ideas. The patient is nervous/anxious.      Vitals BP 116/78   Pulse (!) 103   Temp 97.7 F (36.5 C) (Oral)   Ht 5\' 6"  (1.676 m)   Wt 127 lb 3.2 oz (57.7 kg)   SpO2 100%   BMI 20.53 kg/m   Objective:   Physical Exam Vitals and nursing note reviewed.  Constitutional:      General: She is not in acute distress.    Appearance: Normal appearance.  HENT:     Head: Normocephalic and atraumatic.  Cardiovascular:     Rate and Rhythm: Normal rate and regular rhythm.     Pulses: Normal pulses.     Heart sounds: Normal heart sounds.  Pulmonary:     Effort: Pulmonary effort is normal.     Breath sounds: Normal breath sounds. No wheezing, rhonchi or rales.  Musculoskeletal:        General: Normal range of motion.     Right lower leg: No edema.     Left lower leg: No edema.  Skin:    General: Skin  is warm and dry.     Findings: No lesion or rash.  Neurological:     General: No focal deficit present.     Mental Status: She is alert and oriented to person, place, and time.     Cranial Nerves: No cranial nerve deficit.  Psychiatric:        Mood and Affect: Mood normal.        Behavior: Behavior normal.        Thought Content: Thought content normal.        Judgment: Judgment normal.      Assessment and Plan   1. Generalized anxiety disorder - FLUoxetine (PROZAC) 20 MG capsule; Take 1 capsule (20 mg total) by mouth daily.  Dispense: 30 capsule; Refill: 1 - clonazePAM (KLONOPIN) 0.5 MG tablet; Take 1 tablet (0.5 mg total)  by mouth 2 (two) times daily as needed for anxiety.  Dispense: 20 tablet; Refill: 0 - escitalopram (LEXAPRO) 10 MG tablet; Take 1/2 tab p.o. daily for 7 days, then take 1/2 tab every other day for 3 doses. Then discontinue.  Dispense: 6 tablet; Refill: 0  2. Depression, major, single episode, moderate (HCC) - FLUoxetine (PROZAC) 20 MG capsule; Take 1 capsule (20 mg total) by mouth daily.  Dispense: 30 capsule; Refill: 1 - clonazePAM (KLONOPIN) 0.5 MG tablet; Take 1 tablet (0.5 mg total) by mouth 2 (two) times daily as needed for anxiety.  Dispense: 20 tablet; Refill: 0 - escitalopram (LEXAPRO) 10 MG tablet; Take 1/2 tab p.o. daily for 7 days, then take 1/2 tab every other day for 3 doses. Then discontinue.  Dispense: 6 tablet; Refill: 0    Taper off lexapro and will start on prozac 20mg . Gave klonapin 0.5mg  bid prn for anxiety to help with the tapering period.  Wrote dc instructions to pt on how to taper off lexapro and start prozac.  Use Klonopin prn. Call if worsening symptoms.  F/u 4 wks

## 2020-10-18 ENCOUNTER — Encounter: Payer: Self-pay | Admitting: Family Medicine

## 2020-10-20 DIAGNOSIS — Z419 Encounter for procedure for purposes other than remedying health state, unspecified: Secondary | ICD-10-CM | POA: Diagnosis not present

## 2020-10-22 ENCOUNTER — Encounter: Payer: Medicaid Other | Admitting: Advanced Practice Midwife

## 2020-10-25 ENCOUNTER — Other Ambulatory Visit: Payer: Self-pay | Admitting: Family Medicine

## 2020-10-25 DIAGNOSIS — F411 Generalized anxiety disorder: Secondary | ICD-10-CM

## 2020-10-25 DIAGNOSIS — F321 Major depressive disorder, single episode, moderate: Secondary | ICD-10-CM

## 2020-11-05 ENCOUNTER — Ambulatory Visit (INDEPENDENT_AMBULATORY_CARE_PROVIDER_SITE_OTHER): Payer: Medicaid Other | Admitting: Advanced Practice Midwife

## 2020-11-05 ENCOUNTER — Encounter: Payer: Self-pay | Admitting: Advanced Practice Midwife

## 2020-11-05 ENCOUNTER — Other Ambulatory Visit: Payer: Self-pay

## 2020-11-05 VITALS — BP 127/74 | HR 87 | Ht 67.0 in | Wt 127.0 lb

## 2020-11-05 DIAGNOSIS — L700 Acne vulgaris: Secondary | ICD-10-CM

## 2020-11-05 DIAGNOSIS — L678 Other hair color and hair shaft abnormalities: Secondary | ICD-10-CM

## 2020-11-05 DIAGNOSIS — N920 Excessive and frequent menstruation with regular cycle: Secondary | ICD-10-CM | POA: Diagnosis not present

## 2020-11-05 NOTE — Progress Notes (Signed)
[facility] Visit  Patient name: Melissa Santana MRN [id number]  Date of birth: November 06, 2000  CC & HPI:  Melissa Santana is a 20 y.o. Caucasian female presenting today for dysmenorrhea, cystic acne, brittle hair.  Also has noticed her voice is hoarse for about a year.  Had online visit and started COCs 3/8. Has a PCP, Dr .Ladona Ridgel.   Pertinent History Reviewed:  Medical & Surgical Hx:   Past Medical History:  Diagnosis Date  . Anemia   . Depression   . GAD (generalized anxiety disorder)   . Menstrual extraction 06/12/2013   History reviewed. No pertinent surgical history. History reviewed. No pertinent family history.  Current Outpatient Medications:  .  clonazePAM (KLONOPIN) 0.5 MG tablet, Take 1 tablet by mouth twice daily as needed for anxiety, Disp: 20 tablet, Rfl: 0 .  FLUoxetine (PROZAC) 20 MG capsule, Take 1 capsule (20 mg total) by mouth daily., Disp: 30 capsule, Rfl: 1 .  norethindrone-ethinyl estradiol (LOESTRIN) 1-20 MG-MCG tablet, Take 1 tablet by mouth daily., Disp: , Rfl:  Social History: Reviewed -  reports that she has never smoked. She has never used smokeless tobacco.  Review of Systems:   Constitutional: Negative for fever and chills Eyes: Negative for visual disturbances Respiratory: Negative for shortness of breath, dyspnea Cardiovascular: Negative for chest pain or palpitations  Gastrointestinal: Negative for vomiting, diarrhea and constipation; no abdominal pain Genitourinary: Negative for dysuria and urgency, vaginal irritation or itching Musculoskeletal: Negative for back pain, joint pain, myalgias  Neurological: Negative for dizziness and headaches    Objective Findings:    Physical Examination: Vitals:   11/05/20 1457  BP: 127/74  Pulse: 87   General appearance - well appearing, and in no distress Mental status - alert, oriented to person, place, and time Chest:  Normal respiratory effort Heart - normal rate and regular  rhythm Pelvic: deferred Musculoskeletal:  Normal range of motion without pain Extremities:  No edema    No results found for this or any previous visit (from the past 24 hour(s)).    Assessment & Plan:  A:   Dysmenorrhea/cystic acne:  Continue COCs. Consider topical if acne doesn't improve  To see PCP about hoarseness--pt made aware that it could be a sign of something serious, so needs to get it checked out.  Check Thyroid panel.  Correct any abnormalities  StartBiotin     No follow-ups on file.  Jacklyn Shell CNM 11/05/2020 3:07 PM

## 2020-11-06 LAB — THYROID PANEL WITH TSH
Free Thyroxine Index: 2.1 (ref 1.2–4.9)
T3 Uptake Ratio: 26 % (ref 24–39)
T4, Total: 8.1 ug/dL (ref 4.5–12.0)
TSH: 2.72 u[IU]/mL (ref 0.450–4.500)

## 2020-11-09 ENCOUNTER — Telehealth: Payer: Self-pay | Admitting: Family Medicine

## 2020-11-09 NOTE — Telephone Encounter (Signed)
Patient called to nurses line this weekend about her prozac and was told that she would get a call from her provider about her medication . I explained to grandmom we didn't know anything about provider calling patient.Patient states medication not working for her and stopped taking it because it was making her feel worst and physically draining her. She has appointment on 3/29 but would like something sooner if possible. Please advise

## 2020-11-09 NOTE — Telephone Encounter (Signed)
Yes, pls tell pt to discontinue medication if she is feeling badly.  Check to see if there is an opening any sooner.  She called the on call line over weekend.  Thx. Dr. Ladona Ridgel

## 2020-11-10 NOTE — Telephone Encounter (Signed)
Patient's appt moved up to 11/12/20 at 3:30pm with Dr Ladona Ridgel  Left message to return call

## 2020-11-10 NOTE — Telephone Encounter (Signed)
Patient notified and verbalized understanding. 

## 2020-11-12 ENCOUNTER — Telehealth: Payer: Self-pay | Admitting: Family Medicine

## 2020-11-12 ENCOUNTER — Telehealth (INDEPENDENT_AMBULATORY_CARE_PROVIDER_SITE_OTHER): Payer: Medicaid Other | Admitting: Family Medicine

## 2020-11-12 ENCOUNTER — Other Ambulatory Visit: Payer: Self-pay

## 2020-11-12 DIAGNOSIS — F411 Generalized anxiety disorder: Secondary | ICD-10-CM | POA: Diagnosis not present

## 2020-11-12 DIAGNOSIS — F321 Major depressive disorder, single episode, moderate: Secondary | ICD-10-CM

## 2020-11-12 MED ORDER — BUPROPION HCL ER (XL) 150 MG PO TB24: 150.0000 mg | ORAL_TABLET | Freq: Every day | ORAL | 0 refills | Status: DC

## 2020-11-12 MED ORDER — ESCITALOPRAM OXALATE 10 MG PO TABS: 10.0000 mg | ORAL_TABLET | Freq: Every day | ORAL | 0 refills | Status: DC

## 2020-11-12 NOTE — Telephone Encounter (Signed)
Ms. Melissa Santana, Melissa Santana are scheduled for a virtual visit with your provider today.    Just as we do with appointments in the office, we must obtain your consent to participate.  Your consent will be active for this visit and any virtual visit you may have with one of our providers in the next 365 days.    If you have a MyChart account, I can also send a copy of this consent to you electronically.  All virtual visits are billed to your insurance company just like a traditional visit in the office.  As this is a virtual visit, video technology does not allow for your provider to perform a traditional examination.  This may limit your provider's ability to fully assess your condition.  If your provider identifies any concerns that need to be evaluated in person or the need to arrange testing such as labs, EKG, etc, we will make arrangements to do so.    Although advances in technology are sophisticated, we cannot ensure that it will always work on either your end or our end.  If the connection with a video visit is poor, we may have to switch to a telephone visit.  With either a video or telephone visit, we are not always able to ensure that we have a secure connection.   I need to obtain your verbal consent now.   Are you willing to proceed with your visit today?   Melissa Santana has provided verbal consent on 11/12/2020 for a virtual visit (video or telephone).   Marlowe Shores, LPN 1/74/0814  4:81 PM

## 2020-11-12 NOTE — Progress Notes (Signed)
Patient ID: Melissa Santana, female    DOB: 2001/06/05, 20 y.o.   MRN: [id number]  Virtual Visit via Telephone Note  I connected with Melissa Santana on 11/12/20 at  3:30 PM EDT by telephone and verified that I am speaking with the correct person using two identifiers.  Location: Patient: home Provider: office   I discussed the limitations, risks, security and privacy concerns of performing an evaluation and management service by telephone and the availability of in person appointments. I also discussed with the patient that there may be a patient responsible charge related to this service. The patient expressed understanding and agreed to proceed.   Chief Complaint  Patient presents with  . Anxiety   Subjective:    HPI Pt needing follow up on anxiety. Pt was on Prozac but she has stopped the med due to it making her sick. Pt also not taking Klonopin any longer. GAD 7 and PHQ-9 completed.   Pt tried to taper off the prozac.  But then couldn't take it and just stopped.  Nausea and dizziness and has no appetite. Not taking klonapin, didn't notice helping with that either.   Pt was on atarax and lexapro and felt was not working either, on last visit.  Today stating "lexapro" was the only medication that helped her feel better, but after a while on it didn't respond as well.  Went up to 20mg  and didn't help either.   Having more depression symptoms. No tearfulness or crying episodes. Sleep- not good. Anxious- lots during the day. Dec appetite.  Medical History Chalisa has a past medical history of Anemia, Depression, GAD (generalized anxiety disorder), and Menstrual extraction (06/12/2013).   Outpatient Encounter Medications as of 11/12/2020  Medication Sig  . buPROPion (WELLBUTRIN XL) 150 MG 24 hr tablet Take 1 tablet (150 mg total) by mouth daily.  11/14/2020 escitalopram (LEXAPRO) 10 MG tablet Take 1 tablet (10 mg total) by mouth daily.  . norethindrone-ethinyl estradiol  (LOESTRIN) 1-20 MG-MCG tablet Take 1 tablet by mouth daily.  . [DISCONTINUED] clonazePAM (KLONOPIN) 0.5 MG tablet Take 1 tablet by mouth twice daily as needed for anxiety  . [DISCONTINUED] FLUoxetine (PROZAC) 20 MG capsule Take 1 capsule (20 mg total) by mouth daily.   No facility-administered encounter medications on file as of 11/12/2020.     Review of Systems  Constitutional: Negative for chills and fever.  HENT: Negative for congestion, rhinorrhea and sore throat.   Respiratory: Negative for cough, shortness of breath and wheezing.   Cardiovascular: Negative for chest pain and leg swelling.  Gastrointestinal: Negative for abdominal pain, diarrhea, nausea and vomiting.  Genitourinary: Negative for dysuria and frequency.  Musculoskeletal: Negative for arthralgias and back pain.  Skin: Negative for rash.  Neurological: Negative for dizziness, weakness and headaches.  Psychiatric/Behavioral: Positive for dysphoric mood and sleep disturbance. Negative for behavioral problems, confusion, self-injury and suicidal ideas. The patient is nervous/anxious.      Vitals LMP 11/03/2020 (Exact Date)   Objective:   Physical Exam  No PE due to phone visit.  Assessment and Plan   1. Generalized anxiety disorder - escitalopram (LEXAPRO) 10 MG tablet; Take 1 tablet (10 mg total) by mouth daily.  Dispense: 30 tablet; Refill: 0 - buPROPion (WELLBUTRIN XL) 150 MG 24 hr tablet; Take 1 tablet (150 mg total) by mouth daily.  Dispense: 30 tablet; Refill: 0  2. Depression, major, single episode, moderate (HCC) - escitalopram (LEXAPRO) 10 MG tablet; Take 1 tablet (10 mg  total) by mouth daily.  Dispense: 30 tablet; Refill: 0 - buPROPion (WELLBUTRIN XL) 150 MG 24 hr tablet; Take 1 tablet (150 mg total) by mouth daily.  Dispense: 30 tablet; Refill: 0   Depression/ Anxiety- worsening with depression symptoms. Will give a trial of wellbutrin.  Pt to discontinue lexapro, pt not feeling well on lexapro, was  having side effects. Cont meds.   Return in about 4 weeks (around 12/10/2020) for f/u anxiety-phone.   Follow Up Instructions:    I discussed the assessment and treatment plan with the patient. The patient was provided an opportunity to ask questions and all were answered. The patient agreed with the plan and demonstrated an understanding of the instructions.   The patient was advised to call back or seek an in-person evaluation if the symptoms worsen or if the condition fails to improve as anticipated.  I provided 12 minutes of non-face-to-face time during this encounter.

## 2020-11-17 ENCOUNTER — Telehealth: Payer: Medicaid Other | Admitting: Family Medicine

## 2020-11-20 DIAGNOSIS — Z419 Encounter for procedure for purposes other than remedying health state, unspecified: Secondary | ICD-10-CM | POA: Diagnosis not present

## 2020-11-22 ENCOUNTER — Encounter: Payer: Self-pay | Admitting: Family Medicine

## 2020-12-01 ENCOUNTER — Telehealth: Payer: Self-pay

## 2020-12-01 NOTE — Patient Instructions (Signed)
Visit Information  Melissa Santana  - as a part of your Medicaid benefit, you are eligible for care management and care coordination services at no cost or copay. I was unable to reach you by phone today but would be happy to help you with your health related needs. Please feel free to call me @ [phone].   A member of the Managed Medicaid care management team will reach out to you again over the next 7 days.   Gus Puma, BSW, [place] [facility]  [place]  High Risk Managed Medicaid Team

## 2020-12-01 NOTE — Patient Outreach (Signed)
Care Coordination  12/01/2020  Melissa Santana 03/21/2001 [id number]   Medicaid Managed Care   Unsuccessful Outreach Note  12/01/2020 Name: Melissa Santana MRN: [id number] DOB: 2000-10-20  Referred by: Annalee Genta, DO Reason for referral : High Risk Managed Medicaid (MM Screen Telephone Outreach)   An unsuccessful telephone outreach was attempted today. The patient was referred to the case management team for assistance with care management and care coordination.   Follow Up Plan: The care management team will reach out to the patient again over the next 7 days.   Gus Puma, BSW, [place] [facility]  [place]  High Risk Managed Medicaid Team

## 2020-12-09 ENCOUNTER — Telehealth: Payer: Self-pay

## 2020-12-09 DIAGNOSIS — F321 Major depressive disorder, single episode, moderate: Secondary | ICD-10-CM

## 2020-12-09 DIAGNOSIS — F411 Generalized anxiety disorder: Secondary | ICD-10-CM

## 2020-12-10 ENCOUNTER — Telehealth: Payer: Self-pay | Admitting: Family Medicine

## 2020-12-10 ENCOUNTER — Telehealth: Payer: Self-pay

## 2020-12-10 ENCOUNTER — Other Ambulatory Visit: Payer: Self-pay | Admitting: Family Medicine

## 2020-12-10 DIAGNOSIS — F321 Major depressive disorder, single episode, moderate: Secondary | ICD-10-CM

## 2020-12-10 DIAGNOSIS — F411 Generalized anxiety disorder: Secondary | ICD-10-CM

## 2020-12-10 MED ORDER — ESCITALOPRAM OXALATE 10 MG PO TABS: 10.0000 mg | ORAL_TABLET | Freq: Every day | ORAL | 0 refills | Status: DC

## 2020-12-10 MED ORDER — BUPROPION HCL ER (XL) 150 MG PO TB24: 150.0000 mg | ORAL_TABLET | Freq: Every day | ORAL | 0 refills | Status: DC

## 2020-12-10 NOTE — Telephone Encounter (Signed)
Scheduled on 4/27 needing medication called in today if possible only has two pills

## 2020-12-10 NOTE — Telephone Encounter (Signed)
Refills sent to pharmacy and pt is aware  

## 2020-12-10 NOTE — Telephone Encounter (Signed)
Spoke to Pt about making Med Check appt first available  next Wed and she hung up. Tried to call unable to leave voicemail.

## 2020-12-10 NOTE — Patient Outreach (Signed)
Care Coordination  12/10/2020  Melissa Santana 10-03-2000 [id number]   Medicaid Managed Care   Unsuccessful Outreach Note  12/10/2020 Name: Melissa Santana MRN: [id number] DOB: 10-Oct-2000  Referred by: Annalee Genta, DO Reason for referral : High Risk Managed Medicaid (MM Screen Unsuccessful Telephone Outreach)   A second unsuccessful telephone outreach was attempted today. The patient was referred to the case management team for assistance with care management and care coordination.   Follow Up Plan: The care management team will reach out to the patient again over the next 7 days.   Gus Puma, BSW, [place] [facility]  [place]  High Risk Managed Medicaid Team

## 2020-12-10 NOTE — Telephone Encounter (Signed)
Call back to schedule appointment for 4/26

## 2020-12-10 NOTE — Telephone Encounter (Signed)
Patient is requesting refill on Bupropion 150 mg and escitalopram 10 mg only has 2 pills each left has appointment on 4/27. [facility]

## 2020-12-10 NOTE — Telephone Encounter (Signed)
Prescription sent electronically to pharmacy. Patient notified. 

## 2020-12-10 NOTE — Patient Instructions (Signed)
Visit Information  Ms. Melissa Santana  - as a part of your Medicaid benefit, you are eligible for care management and care coordination services at no cost or copay. I was unable to reach you by phone today but would be happy to help you with your health related needs. Please feel free to call me @ [phone].   A member of the Managed Medicaid care management team will reach out to you again over the next 7 days.   Gearldean Lomanto, BSW, [facility]  [place]  High Risk Managed Medicaid Team    

## 2020-12-10 NOTE — Telephone Encounter (Signed)
Sent my chart message to schedule appointment.

## 2020-12-16 ENCOUNTER — Ambulatory Visit: Payer: Medicaid Other | Admitting: Family Medicine

## 2020-12-16 ENCOUNTER — Encounter: Payer: Self-pay | Admitting: Family Medicine

## 2020-12-20 DIAGNOSIS — Z419 Encounter for procedure for purposes other than remedying health state, unspecified: Secondary | ICD-10-CM | POA: Diagnosis not present

## 2020-12-21 ENCOUNTER — Telehealth: Payer: Self-pay

## 2020-12-21 NOTE — Patient Instructions (Signed)
Visit Information  Ms. [employer]  - as a part of your Medicaid benefit, you are eligible for care management and care coordination services at no cost or copay. I was unable to reach you by phone today but would be happy to help you with your health related needs. Please feel free to call me @ [phone].    Gus Puma, BSW, [facility]  [place]  High Risk Managed Medicaid Team

## 2020-12-21 NOTE — Patient Outreach (Signed)
Care Coordination  12/21/2020  Melissa Santana 2001-07-01 [id number]   Medicaid Managed Care   Unsuccessful Outreach Note  12/21/2020 Name: Melissa Santana MRN: [id number] DOB: Jan 05, 2001  Referred by: Annalee Genta, DO Reason for referral : High Risk Managed Medicaid (MM Unsuccessful Telephone Outreach)   Third unsuccessful telephone outreach was attempted today. The patient was referred to the case management team for assistance with care management and care coordination. The patient's primary care provider has been notified of our unsuccessful attempts to make or maintain contact with the patient. The care management team is pleased to engage with this patient at any time in the future should he/she be interested in assistance from the care management team.   Follow Up Plan: The patient has been provided with contact information for the care management team and has been advised to call with any health related questions or concerns.   Gus Puma, BSW, [facility]  [place]  High Risk Managed Medicaid Team

## 2020-12-30 ENCOUNTER — Ambulatory Visit (INDEPENDENT_AMBULATORY_CARE_PROVIDER_SITE_OTHER): Payer: Medicaid Other | Admitting: Family Medicine

## 2020-12-30 ENCOUNTER — Other Ambulatory Visit: Payer: Self-pay

## 2020-12-30 DIAGNOSIS — F321 Major depressive disorder, single episode, moderate: Secondary | ICD-10-CM | POA: Diagnosis not present

## 2020-12-30 DIAGNOSIS — F411 Generalized anxiety disorder: Secondary | ICD-10-CM | POA: Diagnosis not present

## 2020-12-30 MED ORDER — ESCITALOPRAM OXALATE 20 MG PO TABS: 20.0000 mg | ORAL_TABLET | Freq: Every day | ORAL | 2 refills | Status: DC

## 2020-12-30 MED ORDER — BUSPIRONE HCL 7.5 MG PO TABS: 7.5000 mg | ORAL_TABLET | Freq: Two times a day (BID) | ORAL | 0 refills | Status: DC

## 2020-12-30 NOTE — Progress Notes (Signed)
Patient ID: Melissa Santana, female    DOB: 2000/11/19, 19 y.o.   MRN: [id number]   Chief Complaint  Patient presents with  . Anxiety    Depression follow up - not currently taking wellbutrin due to nausea, insomnia, dizzyness, and worsened anxiety   Subjective:    HPI  F/u depression/anxiety-  Pt still taking 10mg  lexapro.  Not as affective as it was.  Does help some with anxiety.  Felt nausea and dizziness with wellbutrin. Felt sleepy but then couldn't sleep after taking it.  Was taking it at night.  Feeling equally depression and anxiety not as controlled.  School is stressful, going to look for summer work.  Nothing new that happened recently.  Both parents family have depression/anxeity and bipolar disorder. Mom- having bad anxiety Dad- unknown  Birth control for past 3 months.  Not noticed anything changing in moods.   Medical History Melissa Santana has a past medical history of Anemia, Depression, GAD (generalized anxiety disorder), and Menstrual extraction (06/12/2013).   Outpatient Encounter Medications as of 12/30/2020  Medication Sig  . busPIRone (BUSPAR) 7.5 MG tablet Take 1 tablet (7.5 mg total) by mouth 2 (two) times daily.  03/01/2021 escitalopram (LEXAPRO) 20 MG tablet Take 1 tablet (20 mg total) by mouth daily.  . norethindrone-ethinyl estradiol (LOESTRIN) 1-20 MG-MCG tablet Take 1 tablet by mouth daily.  . [DISCONTINUED] escitalopram (LEXAPRO) 10 MG tablet Take 1 tablet (10 mg total) by mouth daily.  . [DISCONTINUED] buPROPion (WELLBUTRIN XL) 150 MG 24 hr tablet Take 1 tablet (150 mg total) by mouth daily. (Patient not taking: Reported on 12/30/2020)   No facility-administered encounter medications on file as of 12/30/2020.     Review of Systems  Constitutional: Negative for chills and fever.  HENT: Negative for congestion, rhinorrhea and sore throat.   Respiratory: Negative for cough, shortness of breath and wheezing.   Cardiovascular: Negative for chest pain  and leg swelling.  Gastrointestinal: Negative for abdominal pain, diarrhea, nausea and vomiting.  Genitourinary: Negative for dysuria and frequency.  Musculoskeletal: Negative for arthralgias and back pain.  Skin: Negative for rash.  Neurological: Negative for dizziness, weakness and headaches.  Psychiatric/Behavioral: Positive for dysphoric mood and sleep disturbance. Negative for agitation, behavioral problems, hallucinations, self-injury and suicidal ideas. The patient is nervous/anxious.      Vitals BP 118/68   Pulse 72   Temp 98.8 F (37.1 C)   Ht 5\' 7"  (1.702 m)   Wt 125 lb (56.7 kg)   LMP 11/27/2020   SpO2 100%   BMI 19.58 kg/m   Objective:   Physical Exam Vitals and nursing note reviewed.  Constitutional:      General: She is not in acute distress.    Appearance: Normal appearance.  HENT:     Head: Normocephalic and atraumatic.  Cardiovascular:     Rate and Rhythm: Normal rate and regular rhythm.     Pulses: Normal pulses.     Heart sounds: Normal heart sounds.  Pulmonary:     Effort: Pulmonary effort is normal.     Breath sounds: Normal breath sounds. No wheezing, rhonchi or rales.  Musculoskeletal:        General: Normal range of motion.     Right lower leg: No edema.     Left lower leg: No edema.  Skin:    General: Skin is warm and dry.     Findings: No lesion or rash.  Neurological:     General: No focal deficit present.  Mental Status: She is alert and oriented to person, place, and time.     Cranial Nerves: No cranial nerve deficit.  Psychiatric:        Mood and Affect: Mood normal.        Behavior: Behavior normal.        Thought Content: Thought content normal.        Judgment: Judgment normal.      Assessment and Plan   1. Generalized anxiety disorder - escitalopram (LEXAPRO) 20 MG tablet; Take 1 tablet (20 mg total) by mouth daily.  Dispense: 30 tablet; Refill: 2 - busPIRone (BUSPAR) 7.5 MG tablet; Take 1 tablet (7.5 mg total) by  mouth 2 (two) times daily.  Dispense: 60 tablet; Refill: 0  2. Depression, major, single episode, moderate (HCC) - escitalopram (LEXAPRO) 20 MG tablet; Take 1 tablet (20 mg total) by mouth daily.  Dispense: 30 tablet; Refill: 2    Pt not feeling depression and anxiety as controlled. Pt is willing to inc from 10mg  lexapro to 20mg  and try buspar prn for anxiety.  Pt to call if having worsening symptoms or SI. Didn't do well with wellbutrin, had some side effects.   Return in about 3 months (around 04/01/2021) for f/u anxiety/depression.

## 2021-01-01 ENCOUNTER — Telehealth: Payer: Medicaid Other | Admitting: Emergency Medicine

## 2021-01-01 DIAGNOSIS — L709 Acne, unspecified: Secondary | ICD-10-CM | POA: Diagnosis not present

## 2021-01-01 MED ORDER — BENZOYL PEROXIDE-ERYTHROMYCIN 5-3 % EX GEL: Freq: Two times a day (BID) | CUTANEOUS | 0 refills | Status: DC

## 2021-01-01 MED ORDER — DOXYCYCLINE HYCLATE 100 MG PO CAPS: 100.0000 mg | ORAL_CAPSULE | Freq: Two times a day (BID) | ORAL | 0 refills | Status: AC

## 2021-01-01 NOTE — Progress Notes (Signed)
We are sorry that you are experiencing this issue.  Here is how we plan to help!  Based on what you shared with me it looks like you have cystic acne.  Acne is a disorder of the hair follicles and oil glands (sebaceous glands). The sebaceous glands secrete oils to keep the skin moist.  When the glands get clogged, it can lead to pimples or cysts.  These cysts may become infected and leave scars. Acne is very common and normally occurs at puberty.  Acne is also inherited.  Your personal care plan consists of the following recommendations:  I recommend that you use a daily cleanser  You may try a topical exfoliator and salicylic acid scrub.  These scrubs have coarse particles that clear your pores but may also irritate your skin.  I have prescribed a topical gel with an antibiotic:  Benzoyl peroxide-erythromycin gel.  This gel should be applied to the affected areas twice a day. Be sure to read the package insert for potential side effects.  I have also prescribed one of the following additional therapies:  Doxycycline an oral antibiotic 100 mg twice a day  If excessive dryness or peeling occurs, reduce dose frequency or concentration of the topical scrubs.  If excessive stinging or burning occurs, remove the topical gel with mild soap and water and resume at a lower dose the next day.  Remember oral antibiotics and topical acne treatments may increase your sensitivity to the sun!  HOME CARE:  Do not squeeze pimples because that can often lead to infections, worse acne, and scars.  Use a moisturizer that contains retinoid or fruit acids that may inhibit the development of new acne lesions.  Although there is not a clear link that foods can cause acne, doctors do believe that too many sweets predispose you to skin problems.  GET HELP RIGHT AWAY IF:  If your acne gets worse or is not better within 10 days.  If you become depressed.  If you become pregnant, discontinue medications and  call your OB/GYN.  MAKE SURE YOU:  Understand these instructions.  Will watch your condition.  Will get help right away if you are not doing well or get worse.   Your e-visit answers were reviewed by a board certified advanced clinical practitioner to complete your personal care plan.  Depending upon the condition, your plan could have included both over the counter or prescription medications.  Please review your pharmacy choice.  If there is a problem, you may contact your provider through [employer] and have the prescription routed to another pharmacy.  Your safety is important to [place].  If you have drug allergies check your prescription carefully.  For the next 24 hours you can use MyChart to ask questions about today's visit, request a non-urgent call back, or ask for a work or school excuse from your e-visit provider.  You will get an email in the next two days asking about your experience. I hope that your e-visit has been valuable and will speed your recovery.  Approximately 5 minutes was spent documenting and reviewing patient's chart.

## 2021-01-04 ENCOUNTER — Telehealth: Payer: Medicaid Other | Admitting: Physician Assistant

## 2021-01-04 DIAGNOSIS — R490 Dysphonia: Secondary | ICD-10-CM

## 2021-01-05 NOTE — Progress Notes (Signed)
Based on what you shared with me, I feel your condition warrants further evaluation and I recommend that you be seen for a face to face visit. Giving chronic, intermittent hoarseness, you need to be seen in person for evaluation. You may need referral to ENT so they can take a look at your voicebox with use of mirrors or a little camera in the office to make sure there is nothing on the vocal cords (nodule, cyst, swelling) causing your symptoms.  Please contact your primary care physician practice to be seen. Many offices offer virtual options to be seen via video if you are not comfortable going in person to a medical facility at this time.  If you do not have a PCP, [place] offers a free physician referral service available at [phone]. Our trained staff has the experience, knowledge and resources to put you in touch with a physician who is right for you.   You also have the option of a video visit through [email or web address]  If you are having a true medical emergency please call 911.  NOTE: If you entered your credit card information for this eVisit, you will not be charged. You may see a "hold" on your card for the $35 but that hold will drop off and you will not have a charge processed.  Your e-visit answers were reviewed by a board certified advanced clinical practitioner to complete your personal care plan.  Thank you for using e-Visits.

## 2021-01-20 DIAGNOSIS — Z419 Encounter for procedure for purposes other than remedying health state, unspecified: Secondary | ICD-10-CM | POA: Diagnosis not present

## 2021-01-21 ENCOUNTER — Ambulatory Visit: Payer: Medicaid Other | Admitting: Family Medicine

## 2021-01-21 ENCOUNTER — Encounter: Payer: Self-pay | Admitting: Family Medicine

## 2021-02-19 DIAGNOSIS — Z419 Encounter for procedure for purposes other than remedying health state, unspecified: Secondary | ICD-10-CM | POA: Diagnosis not present

## 2021-03-06 IMAGING — US US ABDOMEN COMPLETE
1 series · 14 of 25 positions shown · non-contrast
Comparison: 03/19/2015 CT abdomen pelvis.

CLINICAL DATA: Abdominal pain

EXAM:
ABDOMEN ULTRASOUND COMPLETE

[Series 1: us abdomen complete · 14 of 125 slices shown]
[im 1/125]
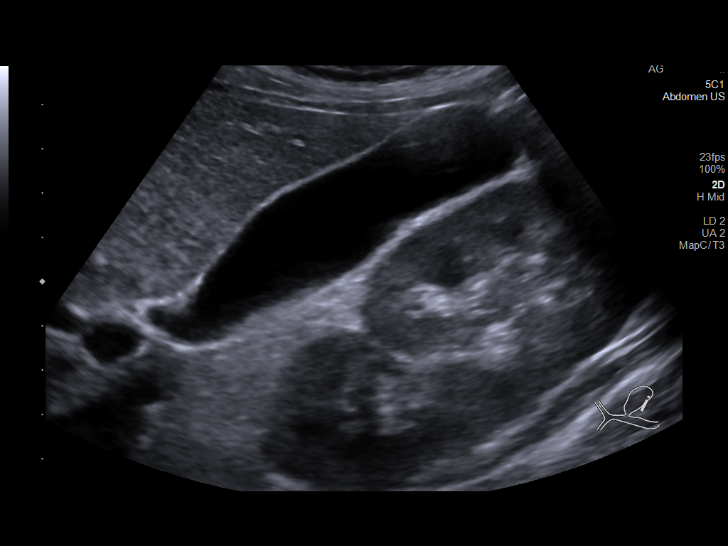
[im 11/125]
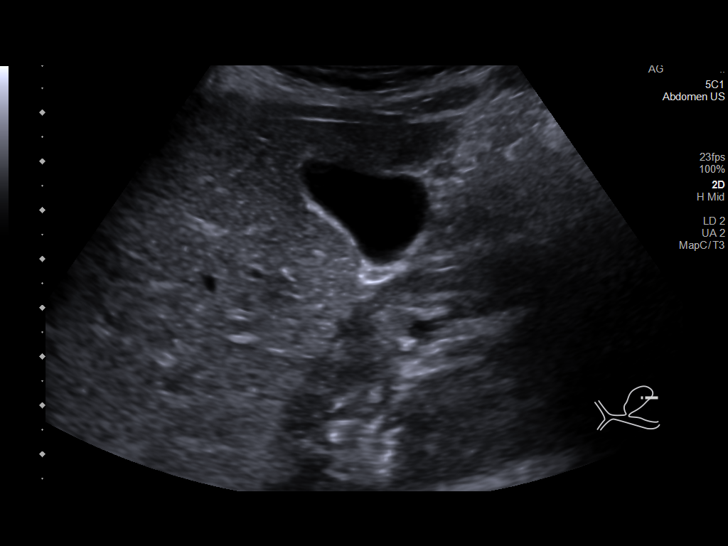
[im 21/125]
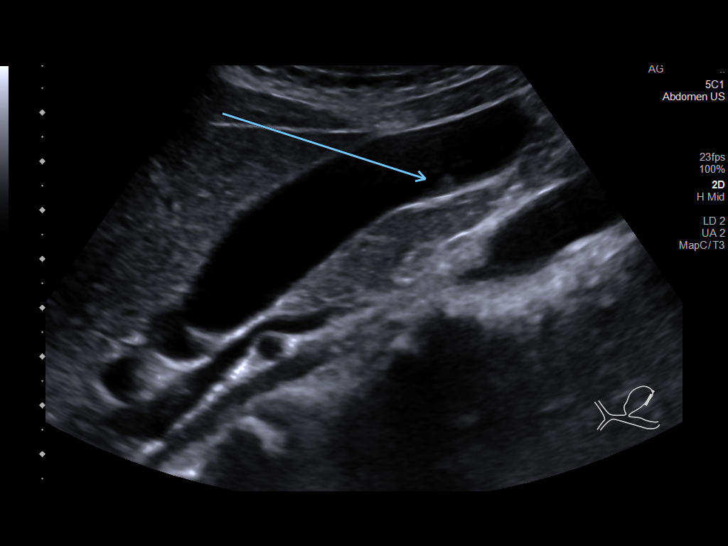
[im 32/125]
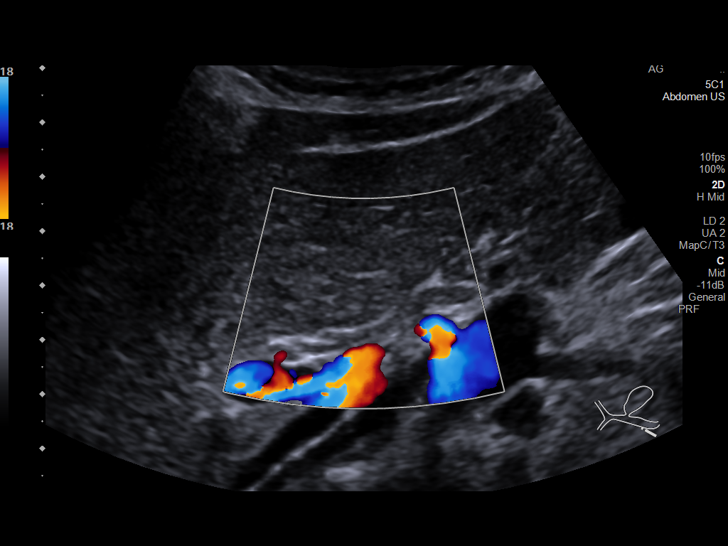
[im 42/125]
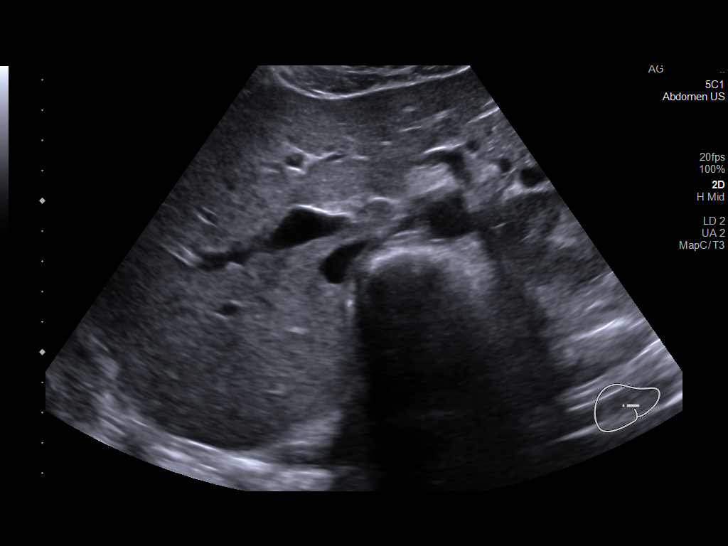
[im 47/125]
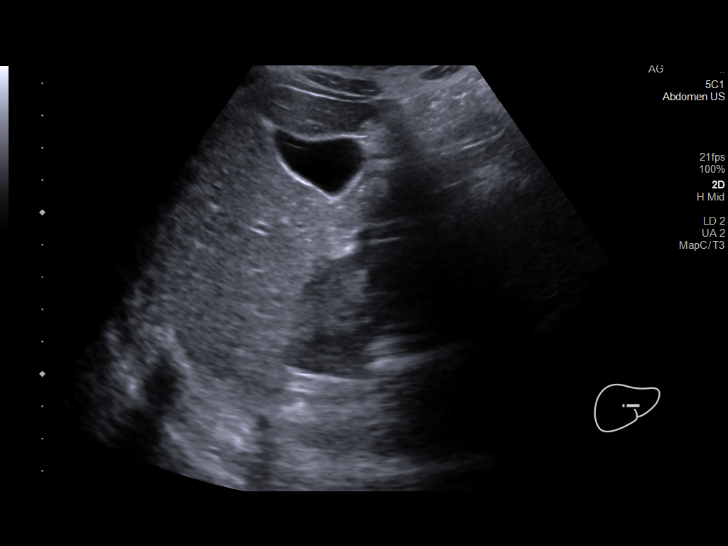
[im 57/125]
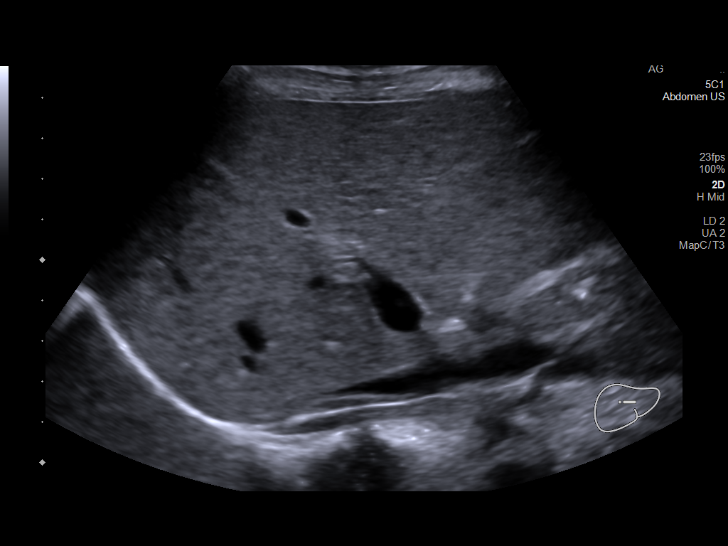
[im 68/125]
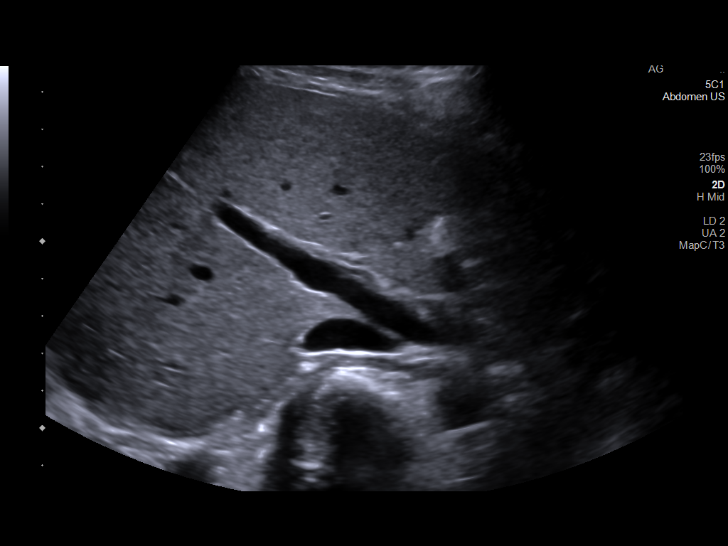
[im 78/125]
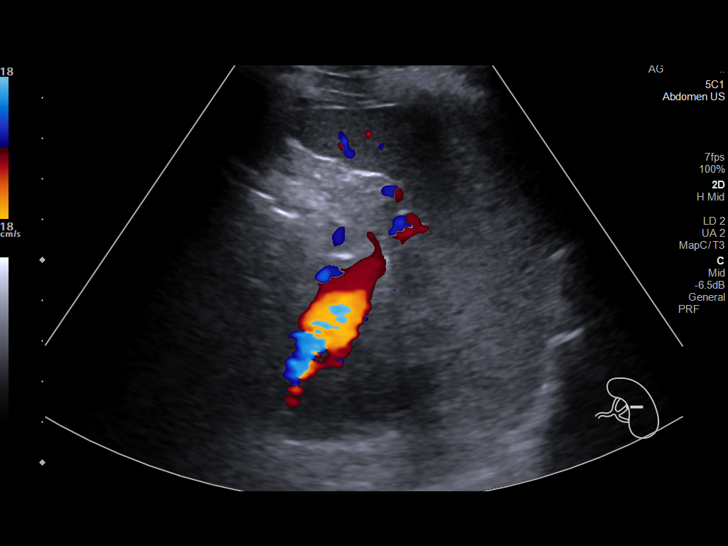
[im 83/125]
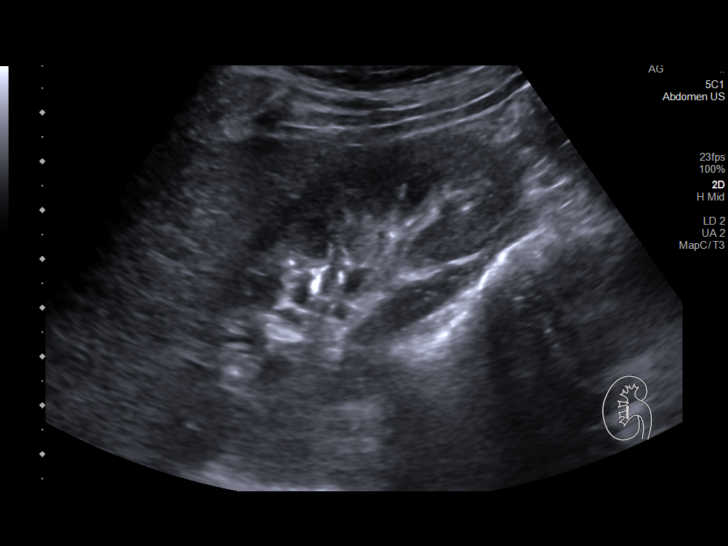
[im 94/125]
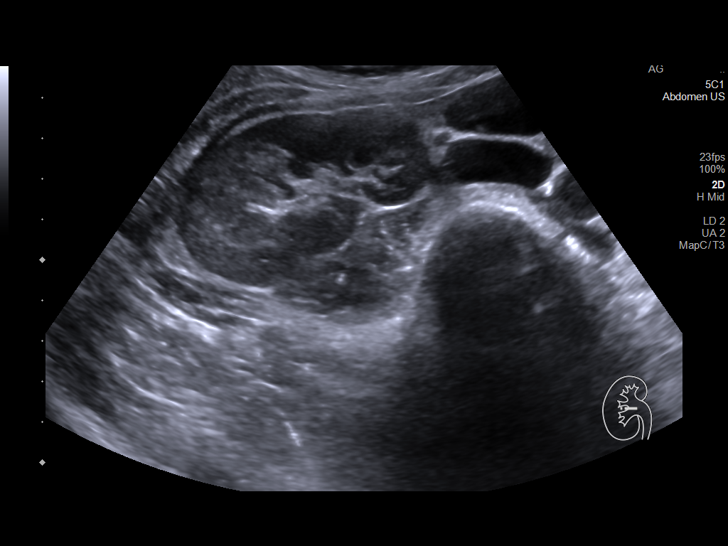
[im 104/125]
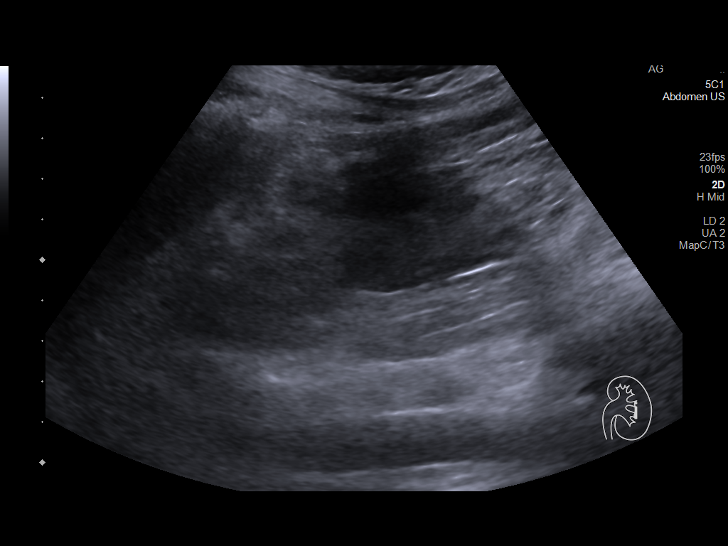
[im 114/125]
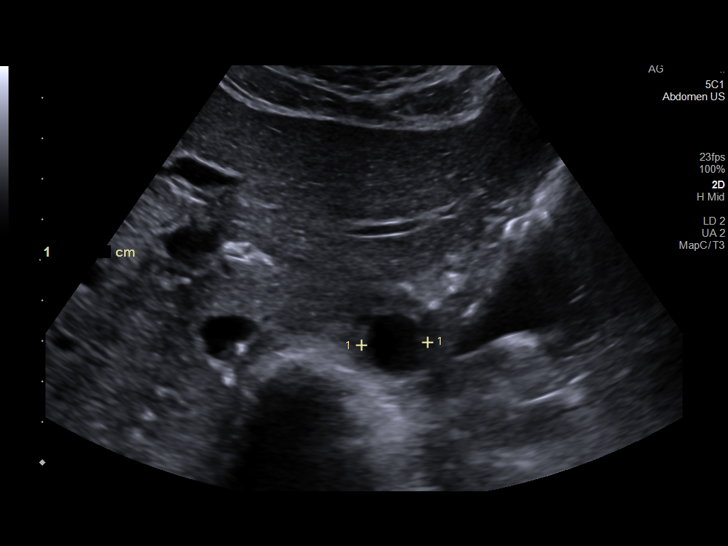
[im 125/125]
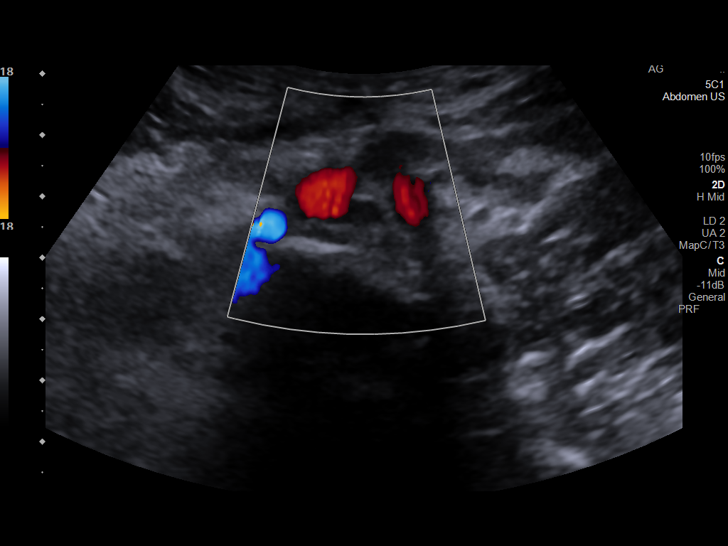

[14 of 25 positions shown; findings below may reference images not displayed]

FINDINGS: Gallbladder: No gallstones or wall thickening visualized. 2 mm
polyp. No sonographic Murphy sign noted by sonographer.

Common bile duct: Diameter: 1.6 mm

Liver: No focal lesion identified. Within normal limits in
parenchymal echogenicity. Portal vein is patent on color Doppler
imaging with normal direction of blood flow towards the liver.

IVC: No abnormality visualized.

Pancreas: Visualized portion unremarkable.

Spleen: Size and appearance within normal limits.

Right Kidney: Length: 10.9 cm. Echogenicity within normal limits. No
mass or hydronephrosis visualized.

Left Kidney: Length: 10.8 cm. Echogenicity within normal limits. No
mass or hydronephrosis visualized.

Abdominal aorta: No aneurysm visualized.

Other findings: None.
IMPRESSION: Incidental 2 mm gallbladder polyp.  No follow-up required.

Otherwise unremarkable abdominal ultrasound.

## 2021-03-15 ENCOUNTER — Ambulatory Visit: Payer: Medicaid Other | Admitting: Family Medicine

## 2021-03-22 DIAGNOSIS — Z419 Encounter for procedure for purposes other than remedying health state, unspecified: Secondary | ICD-10-CM | POA: Diagnosis not present

## 2021-03-26 ENCOUNTER — Ambulatory Visit: Payer: Medicaid Other | Admitting: Family Medicine

## 2021-04-01 ENCOUNTER — Ambulatory Visit: Payer: Medicaid Other | Admitting: Family Medicine

## 2021-04-14 ENCOUNTER — Other Ambulatory Visit: Payer: Medicaid Other | Admitting: Advanced Practice Midwife

## 2021-04-22 DIAGNOSIS — Z419 Encounter for procedure for purposes other than remedying health state, unspecified: Secondary | ICD-10-CM | POA: Diagnosis not present

## 2021-05-13 ENCOUNTER — Other Ambulatory Visit: Payer: Medicaid Other | Admitting: Advanced Practice Midwife

## 2021-05-22 DIAGNOSIS — Z419 Encounter for procedure for purposes other than remedying health state, unspecified: Secondary | ICD-10-CM | POA: Diagnosis not present

## 2021-05-25 ENCOUNTER — Telehealth: Payer: Medicaid Other | Admitting: Family

## 2021-05-25 DIAGNOSIS — L700 Acne vulgaris: Secondary | ICD-10-CM | POA: Diagnosis not present

## 2021-05-26 MED ORDER — DOXYCYCLINE HYCLATE 100 MG PO TABS: 100.0000 mg | ORAL_TABLET | Freq: Two times a day (BID) | ORAL | 1 refills | Status: DC

## 2021-05-26 MED ORDER — CLINDAMYCIN PHOS-BENZOYL PEROX 1-5 % EX GEL: Freq: Two times a day (BID) | CUTANEOUS | 0 refills | Status: DC

## 2021-05-26 NOTE — Progress Notes (Signed)
We are sorry that you are experiencing this issue.  Here is how we plan to help!  Based on what you shared with me it looks like you have uncomplicated acne.  Acne is a disorder of the hair follicles and oil glands (sebaceous glands). The sebaceous glands secrete oils to keep the skin moist.  When the glands get clogged, it can lead to pimples or cysts.  These cysts may become infected and leave scars. Acne is very common and normally occurs at puberty.  Acne is also inherited.  Your personal care plan consists of the following recommendations:  I recommend that you use a daily cleanser  You might try an over the counter cleanser that has benzoyl peroxide.  I recommend that you start with a product that has 2.5% benzoyl peroxide.  Stronger concentrations have not been shown to be more effective.  I have prescribed a topical gel with an antibiotic:  Clindamycin-benzoyl peroxide gel.  This gel should be applied to the affected areas twice a day.  Be sure to read the package insert to understand potential side effects.  I have also prescribed one of the following additional therapies:  Doxycycline an oral antibiotic 100 mg twice a day.  If excessive dryness or peeling occurs, reduce dose frequency or concentration of the topical scrubs.  If excessive stinging or burning occurs, remove the topical gel with mild soap and water and resume at a lower dose the next day.  Remember oral antibiotics and topical acne treatments may increase your sensitivity to the sun!  HOME CARE: Do not squeeze pimples because that can often lead to infections, worse acne, and scars. Use a moisturizer that contains retinoid or fruit acids that may inhibit the development of new acne lesions. Although there is not a clear link that foods can cause acne, doctors do believe that too many sweets predispose you to skin problems.  GET HELP RIGHT AWAY IF: If your acne gets worse or is not better within 10 days. If you  become depressed. If you become pregnant, discontinue medications and call your OB/GYN.  MAKE SURE YOU: Understand these instructions. Will watch your condition. Will get help right away if you are not doing well or get worse.  Thank you for choosing an e-visit.  Your e-visit answers were reviewed by a board certified advanced clinical practitioner to complete your personal care plan. Depending upon the condition, your plan could have included both over the counter or prescription medications.  Please review your pharmacy choice. Make sure the pharmacy is open so you can pick up prescription now. If there is a problem, you may contact your provider through [employer] and have the prescription routed to another pharmacy.  Your safety is important to [place]. If you have drug allergies check your prescription carefully.   For the next 24 hours you can use MyChart to ask questions about today's visit, request a non-urgent call back, or ask for a work or school excuse. You will get an email in the next two days asking about your experience. I hope that your e-visit has been valuable and will speed your recovery.  Approximately 5 minutes was spent documenting and reviewing patient's chart.

## 2021-05-27 ENCOUNTER — Other Ambulatory Visit: Payer: Self-pay | Admitting: Physician Assistant

## 2021-06-10 ENCOUNTER — Telehealth: Payer: Medicaid Other | Admitting: Physician Assistant

## 2021-06-10 DIAGNOSIS — A084 Viral intestinal infection, unspecified: Secondary | ICD-10-CM

## 2021-06-10 MED ORDER — ONDANSETRON HCL 4 MG PO TABS: 4.0000 mg | ORAL_TABLET | Freq: Three times a day (TID) | ORAL | 0 refills | Status: DC | PRN

## 2021-06-10 NOTE — Progress Notes (Signed)
E-Visit for Nausea and Vomiting   We are sorry that you are not feeling well. Here is how we plan to help!  Based on what you have shared with me it looks like you have a Virus that is irritating your GI tract.  Vomiting is the forceful emptying of a portion of the stomach's content through the mouth.  Although nausea and vomiting can make you feel miserable, it's important to remember that these are not diseases, but rather symptoms of an underlying illness.  When we treat short term symptoms, we always caution that any symptoms that persist should be fully evaluated in a medical office.  I have prescribed a medication that will help alleviate your symptoms and allow you to stay hydrated:  Zofran 4 mg 1 tablet every 8 hours as needed for nausea and vomiting  You can get Imodium OTC for the diarrhea.  HOME CARE: Drink clear liquids.  This is very important! Dehydration (the lack of fluid) can lead to a serious complication.  Start off with 1 tablespoon every 5 minutes for 8 hours. You may begin eating bland foods after 8 hours without vomiting.  Start with saltine crackers, white bread, rice, mashed potatoes, applesauce. After 48 hours on a bland diet, you may resume a normal diet. Try to go to sleep.  Sleep often empties the stomach and relieves the need to vomit.  GET HELP RIGHT AWAY IF:  Your symptoms do not improve or worsen within 2 days after treatment. You have a fever for over 3 days. You cannot keep down fluids after trying the medication.  MAKE SURE YOU:  Understand these instructions. Will watch your condition. Will get help right away if you are not doing well or get worse.    Thank you for choosing an e-visit.  Your e-visit answers were reviewed by a board certified advanced clinical practitioner to complete your personal care plan. Depending upon the condition, your plan could have included both over the counter or prescription medications.  Please review your  pharmacy choice. Make sure the pharmacy is open so you can pick up prescription now. If there is a problem, you may contact your provider through [employer] and have the prescription routed to another pharmacy.  Your safety is important to [place]. If you have drug allergies check your prescription carefully.   For the next 24 hours you can use MyChart to ask questions about today's visit, request a non-urgent call back, or ask for a work or school excuse. You will get an email in the next two days asking about your experience. I hope that your e-visit has been valuable and will speed your recovery.  I provided 6 minutes of non face-to-face time during this encounter for chart review and documentation.

## 2021-06-11 ENCOUNTER — Emergency Department (HOSPITAL_COMMUNITY)
Admission: EM | Admit: 2021-06-11 | Discharge: 2021-06-11 | Disposition: A | Discharge status: Home / Self Care | Payer: Medicaid Other | Attending: Emergency Medicine | Admitting: Emergency Medicine

## 2021-06-11 ENCOUNTER — Other Ambulatory Visit: Payer: Self-pay

## 2021-06-11 ENCOUNTER — Encounter (HOSPITAL_COMMUNITY): Payer: Self-pay | Admitting: Emergency Medicine

## 2021-06-11 DIAGNOSIS — Z79899 Other long term (current) drug therapy: Secondary | ICD-10-CM | POA: Diagnosis not present

## 2021-06-11 DIAGNOSIS — R634 Abnormal weight loss: Secondary | ICD-10-CM | POA: Insufficient documentation

## 2021-06-11 DIAGNOSIS — R112 Nausea with vomiting, unspecified: Secondary | ICD-10-CM | POA: Insufficient documentation

## 2021-06-11 LAB — COMPREHENSIVE METABOLIC PANEL
ALT: 15 U/L (ref 0–44)
AST: 20 U/L (ref 15–41)
Albumin: 4.6 g/dL (ref 3.5–5.0)
Alkaline Phosphatase: 49 U/L (ref 38–126)
Anion gap: 10 (ref 5–15)
BUN: 16 mg/dL (ref 6–20)
CO2: 27 mmol/L (ref 22–32)
Calcium: 9.2 mg/dL (ref 8.9–10.3)
Chloride: 104 mmol/L (ref 98–111)
Creatinine, Ser: 0.57 mg/dL (ref 0.44–1.00)
GFR, Estimated: >60 mL/min (ref >60)
Glucose, Bld: 83 mg/dL (ref 70–99)
Potassium: 4.2 mmol/L (ref 3.5–5.1)
Sodium: 141 mmol/L (ref 135–145)
Total Bilirubin: 0.6 mg/dL (ref 0.3–1.2)
Total Protein: 8 g/dL (ref 6.5–8.1)

## 2021-06-11 LAB — CBC WITH DIFFERENTIAL/PLATELET
Abs Immature Granulocytes: 0.02 10*3/uL (ref 0.00–0.07)
Basophils Absolute: 0 10*3/uL (ref 0.0–0.1)
Basophils Relative: 1 %
Eosinophils Absolute: 0 10*3/uL (ref 0.0–0.5)
Eosinophils Relative: 0 %
HCT: 41 % (ref 36.0–46.0)
Hemoglobin: 13 g/dL (ref 12.0–15.0)
Immature Granulocytes: 0 %
Lymphocytes Relative: 23 %
Lymphs Abs: 1.9 10*3/uL (ref 0.7–4.0)
MCH: 27 pg (ref 26.0–34.0)
MCHC: 31.7 g/dL (ref 30.0–36.0)
MCV: 85.2 fL (ref 80.0–100.0)
Monocytes Absolute: 0.5 10*3/uL (ref 0.1–1.0)
Monocytes Relative: 6 %
Neutro Abs: 6 10*3/uL (ref 1.7–7.7)
Neutrophils Relative %: 70 %
Platelets: 247 10*3/uL (ref 150–400)
RBC: 4.81 MIL/uL (ref 3.87–5.11)
RDW: 13.9 % (ref 11.5–15.5)
WBC: 8.4 10*3/uL (ref 4.0–10.5)
nRBC: 0 % (ref 0.0–0.2)

## 2021-06-11 LAB — TSH: TSH: 1.025 u[IU]/mL (ref 0.350–4.500)

## 2021-06-11 MED ORDER — LORAZEPAM 2 MG/ML IJ SOLN
0.5000 mg | Freq: Once | INTRAMUSCULAR | Status: AC
  Administered 2021-06-11: 0.5 mg via INTRAVENOUS
  Filled 2021-06-11: qty 1

## 2021-06-11 MED ORDER — ONDANSETRON HCL 4 MG/2ML IJ SOLN
4.0000 mg | Freq: Once | INTRAMUSCULAR | Status: AC
  Administered 2021-06-11: 4 mg via INTRAVENOUS
  Filled 2021-06-11: qty 2

## 2021-06-11 MED ORDER — ONDANSETRON 4 MG PO TBDP: 4.0000 mg | ORAL_TABLET | Freq: Three times a day (TID) | ORAL | 0 refills | Status: DC | PRN

## 2021-06-11 MED ORDER — SODIUM CHLORIDE 0.9 % IV BOLUS
1000.0000 mL | Freq: Once | INTRAVENOUS | Status: AC
  Administered 2021-06-11: 1000 mL via INTRAVENOUS

## 2021-06-11 NOTE — ED Provider Notes (Addendum)
[facility] EMERGENCY DEPARTMENT Provider Note   CSN: [id number] Arrival date & time: 06/11/21  1448     History Chief Complaint  Patient presents with   Emesis    Melissa Santana is a 20 y.o. female.   Emesis Associated symptoms: no abdominal pain, no arthralgias, no chills, no cough, no fever and no sore throat    Patient presents with emesis.  Started 3 days ago, its constant.  Denies any hematemesis, no abdominal pain associated with it.  Patient has had an irregular cycle over the last few months, denies any pelvic pain.  Patient reports she recently tapered off of Seroquel, does not smoke any marijuana.  Has not tried any alleviating factors, eating and drinking are aggravating factors.  Patient reports she has a history of similar presentation, reports it is usually associate with anxiety.  She does states she is under increased stress lately.  She was tested for thyroid imbalance before starting on the antianxiety medicine but it has been over a year.  He does endorse some weight loss, denies any chest pain or shortness of breath.  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 No pertinent surgical history.   OB History     Gravida  0   Para  0   Term  0   Preterm  0   AB  0   Living  0      SAB  0   IAB  0   Ectopic  0   Multiple  0   Live Births  0           No family history on file.  Social History   Tobacco Use   Smoking status: Never   Smokeless tobacco: Never  Vaping Use   Vaping Use: Never  used  Substance Use Topics   Alcohol use: No   Drug use: No    Home Medications Prior to Admission medications   Medication Sig Start Date End Date Taking? Authorizing Provider  busPIRone (BUSPAR) 7.5 MG tablet Take 1 tablet (7.5 mg total) by mouth 2 (two) times daily. 12/30/20   Annalee Genta, DO  doxycycline (VIBRA-TABS) 100 MG tablet Take 1 tablet (100 mg total) by mouth 2 (two) times daily. 05/26/21   Jannifer Rodney A, FNP  escitalopram (LEXAPRO) 20 MG tablet Take 1 tablet (20 mg total) by mouth daily. 12/30/20   Ladona Ridgel, Malena M, DO  ondansetron (ZOFRAN) 4 MG tablet Take 1 tablet (4 mg total) by mouth every 8 (eight) hours as needed for nausea or vomiting. 06/10/21   Margaretann Loveless, PA-C    Allergies    Patient has no known allergies.  Review of Systems   Review of Systems  Constitutional:  Negative for chills and fever.  HENT:  Negative for ear pain and sore throat.   Eyes:  Negative for pain and visual disturbance.  Respiratory:  Negative for cough and shortness of breath.   Cardiovascular:  Negative for chest pain and palpitations.  Gastrointestinal:  Positive for nausea and  vomiting. Negative for abdominal pain.  Genitourinary:  Negative for dysuria and hematuria.  Musculoskeletal:  Negative for arthralgias and back pain.  Skin:  Negative for color change and rash.  Neurological:  Negative for seizures and syncope.  All other systems reviewed and are negative.  Physical Exam Updated Vital Signs BP 128/83 (BP Location: Left Arm)   Pulse 86   Temp 98.5 F (36.9 C) (Oral)   Resp 19   Ht 5\' 7"  (1.702 m)   Wt 57.6 kg   SpO2 100%   BMI 19.89 kg/m   Physical Exam Vitals and nursing note reviewed. Exam conducted with a chaperone present.  Constitutional:      Appearance: Normal appearance.  HENT:     Head: Normocephalic and atraumatic.  Eyes:     General: No scleral icterus.       Right eye: No discharge.        Left eye: No discharge.     Extraocular  Movements: Extraocular movements intact.     Pupils: Pupils are equal, round, and reactive to light.  Cardiovascular:     Rate and Rhythm: Normal rate and regular rhythm.     Pulses: Normal pulses.     Heart sounds: Normal heart sounds. No murmur heard.   No friction rub. No gallop.  Pulmonary:     Effort: Pulmonary effort is normal. No respiratory distress.     Breath sounds: Normal breath sounds.  Abdominal:     General: Abdomen is flat. Bowel sounds are normal. There is no distension.     Palpations: Abdomen is soft.     Tenderness: There is no abdominal tenderness.     Comments: Abdomen is soft and nontender  Skin:    General: Skin is warm and dry.     Coloration: Skin is not jaundiced.  Neurological:     Mental Status: She is alert. Mental status is at baseline.     Coordination: Coordination normal.    ED Results / Procedures / Treatments   Labs (all labs ordered are listed, but only abnormal results are displayed) Labs Reviewed  CBC WITH DIFFERENTIAL/PLATELET  COMPREHENSIVE METABOLIC PANEL  PREGNANCY, URINE  TSH    EKG None  Radiology No results found.  Procedures Procedures   Medications Ordered in ED Medications  ondansetron (ZOFRAN) injection 4 mg (has no administration in time range)  sodium chloride 0.9 % bolus 1,000 mL (has no administration in time range)    ED Course  I have reviewed the triage vital signs and the nursing notes.  Pertinent labs & imaging results that were available during my care of the patient were reviewed by me and considered in my medical decision making (see chart for details).    MDM Rules/Calculators/A&P                           Patient vitals are stable, her abdomen is soft and nontender.  We will check labs for electrolyte derangement or signs of infection or inflammation.  Patient has been having irregular bleeding, will check to see her hemoglobin level.  We will plan to treat symptomatically and  reevaluate.  Patient symptoms have improved.  TSH is negative, not consistent with hyperthyroid or thyroid disorder.  No gross electrolyte derangement from the vomiting, no signs of leukocytosis or anemia.  Given patient improvement in symptoms and her close follow-up arranged for Monday and Tuesday this week I think she is appropriate for  discharge.  Believe the symptoms are likely secondary to anxiety, but in the absence of additional findings nothing more emergent work-up is required at this time.  Discharged in stable position.  Final diagnoses:  None    Rx / DC Orders ED Discharge Orders     None        Theron Arista, PA-C 06/11/21 2320    Theron Arista, PA-C 06/11/21 2323    Rozelle Logan, DO 06/11/21 2329

## 2021-06-11 NOTE — Discharge Instructions (Signed)
Take the Zofran every 8 hours as needed for nausea and vomiting.  Follow-up with your psychiatrist and primary care doctor early next week as discussed. Return to the ED if things change or worsen. Your work-up was reassuring, no signs of anemia or thyroid irregularity on your work-up today.

## 2021-06-11 NOTE — ED Triage Notes (Signed)
Pt reports vomiting and rapid heart rate. Pt reports she has been unable to keep her medication down.

## 2021-06-12 ENCOUNTER — Telehealth: Payer: Self-pay

## 2021-06-12 NOTE — Telephone Encounter (Signed)
Transition Care Management Unsuccessful Follow-up Telephone Call  Date of discharge and from where:  06/11/2021 from [facility]  Attempts:  1st Attempt  Reason for unsuccessful TCM follow-up call:  Left voice message

## 2021-06-14 ENCOUNTER — Ambulatory Visit: Payer: Medicaid Other | Admitting: Family Medicine

## 2021-06-14 NOTE — Telephone Encounter (Signed)
Transition Care Management Unsuccessful Follow-up Telephone Call  Date of discharge and from where:  06/11/2021 from [facility]  Attempts:  2nd Attempt  Reason for unsuccessful TCM follow-up call:  Left voice message

## 2021-06-15 DIAGNOSIS — F410 Panic disorder [episodic paroxysmal anxiety] without agoraphobia: Secondary | ICD-10-CM | POA: Diagnosis not present

## 2021-06-15 DIAGNOSIS — Z5181 Encounter for therapeutic drug level monitoring: Secondary | ICD-10-CM | POA: Diagnosis not present

## 2021-06-15 DIAGNOSIS — F411 Generalized anxiety disorder: Secondary | ICD-10-CM | POA: Diagnosis not present

## 2021-06-15 DIAGNOSIS — F4011 Social phobia, generalized: Secondary | ICD-10-CM | POA: Diagnosis not present

## 2021-06-15 DIAGNOSIS — Z79899 Other long term (current) drug therapy: Secondary | ICD-10-CM | POA: Diagnosis not present

## 2021-06-15 DIAGNOSIS — F322 Major depressive disorder, single episode, severe without psychotic features: Secondary | ICD-10-CM | POA: Diagnosis not present

## 2021-06-15 NOTE — Telephone Encounter (Signed)
Transition Care Management Unsuccessful Follow-up Telephone Call  Date of discharge and from where:  06/11/2021-[facility]  Attempts:  3rd Attempt  Reason for unsuccessful TCM follow-up call:  Left voice message

## 2021-06-22 DIAGNOSIS — Z419 Encounter for procedure for purposes other than remedying health state, unspecified: Secondary | ICD-10-CM | POA: Diagnosis not present

## 2021-07-06 DIAGNOSIS — F411 Generalized anxiety disorder: Secondary | ICD-10-CM | POA: Diagnosis not present

## 2021-07-06 DIAGNOSIS — F410 Panic disorder [episodic paroxysmal anxiety] without agoraphobia: Secondary | ICD-10-CM | POA: Diagnosis not present

## 2021-07-06 DIAGNOSIS — F322 Major depressive disorder, single episode, severe without psychotic features: Secondary | ICD-10-CM | POA: Diagnosis not present

## 2021-07-06 DIAGNOSIS — F4011 Social phobia, generalized: Secondary | ICD-10-CM | POA: Diagnosis not present

## 2021-07-19 ENCOUNTER — Other Ambulatory Visit: Payer: Self-pay

## 2021-07-19 ENCOUNTER — Ambulatory Visit (INDEPENDENT_AMBULATORY_CARE_PROVIDER_SITE_OTHER): Payer: Medicaid Other | Admitting: Family Medicine

## 2021-07-19 VITALS — BP 96/71 | HR 92 | Temp 97.1°F | Ht 67.0 in | Wt 121.0 lb

## 2021-07-19 DIAGNOSIS — F321 Major depressive disorder, single episode, moderate: Secondary | ICD-10-CM | POA: Diagnosis not present

## 2021-07-19 DIAGNOSIS — F411 Generalized anxiety disorder: Secondary | ICD-10-CM | POA: Diagnosis not present

## 2021-07-19 MED ORDER — DESVENLAFAXINE SUCCINATE ER 50 MG PO TB24: 50.0000 mg | ORAL_TABLET | Freq: Every day | ORAL | 1 refills | Status: DC

## 2021-07-19 NOTE — Progress Notes (Signed)
Subjective:  Patient ID: Melissa Santana, female    DOB: Apr 22, 2001  Age: 20 y.o. MRN: [id number]  CC: Chief Complaint  Patient presents with   Anxiety    Depression, nausea, fatigue, lack of appetite    HPI:  20 year old female presents with the above complaints.  Patient states that her symptoms having been persistent for years (more so since the age of 60). Even worse over the past  3 months. Has severe anxiety and also depression. Associated fatigue. Also reports ongoing nausea and lack of appetite. Has had recent labs in October which were normal. Currently on menstrual cycle.   Has been on several medications for anxiety and depression and has only had brief improvement with Lexapro. Has tried - Prozac, Zoloft, Wellbutrin, Buspar, Seroquel, Klonopin. Currently seeing a therapist and has been on Pristiq for 2 weeks. No significant improvement yet.  PHQ 9: 22. GAD7: 21.  Patient Active Problem List   Diagnosis Date Noted   Generalized anxiety disorder 05/08/2020   Depression, major, single episode, moderate (HCC) 04/20/2020   Menorrhagia 06/12/2013    Social Hx   Social History   Socioeconomic History   Marital status: Single    Spouse name: Not on file   Number of children: Not on file   Years of education: Not on file   Highest education level: Not on file  Occupational History   Not on file  Tobacco Use   Smoking status: Never   Smokeless tobacco: Never  Vaping Use   Vaping Use: Never used  Substance and Sexual Activity   Alcohol use: No   Drug use: No   Sexual activity: Not Currently    Birth control/protection: Pill  Other Topics Concern   Not on file  Social History Narrative   Not on file   Social Determinants of Health   Financial Resource Strain: High Risk   Difficulty of Paying Living Expenses: Hard  Food Insecurity: Food Insecurity Present   Worried About Running Out of Food in the Last Year: Sometimes true   Ran Out of Food in the Last  Year: Never true  Transportation Needs: Unmet Transportation Needs   Lack of Transportation (Medical): Yes   Lack of Transportation (Non-Medical): Yes  Physical Activity: Sufficiently Active   Days of Exercise per Week: 6 days   Minutes of Exercise per Session: 60 min  Stress: Stress Concern Present   Feeling of Stress : Very much  Social Connections: Socially Isolated   Frequency of Communication with Friends and Family: Three times a week   Frequency of Social Gatherings with Friends and Family: Twice a week   Attends Religious Services: Never   Diplomatic Services operational officer: No   Attends Engineer, structural: Never   Marital Status: Never married    Review of Systems Per HPI  Objective:  BP 96/71   Pulse 92   Temp (!) 97.1 F (36.2 C)   Ht 5\' 7"  (1.702 m)   Wt 121 lb (54.9 kg)   SpO2 96%   BMI 18.95 kg/m   BP/Weight 07/19/2021 06/11/2021 12/30/2020  Systolic BP 96 121 118  Diastolic BP 71 70 68  Wt. (Lbs) 121 127 125  BMI 18.95 19.89 19.58    Physical Exam Constitutional:      General: She is not in acute distress.    Appearance: Normal appearance.  HENT:     Head: Normocephalic and atraumatic.  Eyes:  General:        Right eye: No discharge.        Left eye: No discharge.     Conjunctiva/sclera: Conjunctivae normal.  Cardiovascular:     Rate and Rhythm: Normal rate and regular rhythm.  Pulmonary:     Effort: Pulmonary effort is normal.     Breath sounds: Normal breath sounds. No wheezing or rales.  Neurological:     Mental Status: She is alert.  Psychiatric:     Comments: Flat affect.    Lab Results  Component Value Date   WBC 8.4 06/11/2021   HGB 13.0 06/11/2021   HCT 41.0 06/11/2021   PLT 247 06/11/2021   GLUCOSE 83 06/11/2021   ALT 15 06/11/2021   AST 20 06/11/2021   NA 141 06/11/2021   K 4.2 06/11/2021   CL 104 06/11/2021   CREATININE 0.57 06/11/2021   BUN 16 06/11/2021   CO2 27 06/11/2021   TSH 1.025 06/11/2021      Assessment & Plan:   Problem List Items Addressed This Visit       Other   Depression, major, single episode, moderate (HCC) - Primary    Uncontrolled. Increasing Pristiq to 50 mg daily. Given long standing history and failure of several medications, referring to psychiatry.      Relevant Medications   desvenlafaxine (PRISTIQ) 50 MG 24 hr tablet   Other Relevant Orders   Ambulatory referral to Psychiatry   Generalized anxiety disorder    Uncontrolled. Referring to psychiatry.      Relevant Medications   desvenlafaxine (PRISTIQ) 50 MG 24 hr tablet   Other Relevant Orders   Ambulatory referral to Psychiatry    Meds ordered this encounter  Medications   desvenlafaxine (PRISTIQ) 50 MG 24 hr tablet    Sig: Take 1 tablet (50 mg total) by mouth daily.    Dispense:  90 tablet    Refill:  1    Follow-up:  Return in about 1 month (around 08/18/2021).  Everlene Other DO [facility]

## 2021-07-19 NOTE — Assessment & Plan Note (Signed)
Uncontrolled. Increasing Pristiq to 50 mg daily. Given long standing history and failure of several medications, referring to psychiatry.

## 2021-07-19 NOTE — Assessment & Plan Note (Signed)
Uncontrolled. Referring to psychiatry.

## 2021-07-19 NOTE — Patient Instructions (Signed)
I have increased the Pristiq.  I am placing a referral to psychiatry.  Take care  Dr. Adriana Simas

## 2021-07-22 DIAGNOSIS — Z419 Encounter for procedure for purposes other than remedying health state, unspecified: Secondary | ICD-10-CM | POA: Diagnosis not present

## 2021-07-27 DIAGNOSIS — F322 Major depressive disorder, single episode, severe without psychotic features: Secondary | ICD-10-CM | POA: Diagnosis not present

## 2021-07-27 DIAGNOSIS — F410 Panic disorder [episodic paroxysmal anxiety] without agoraphobia: Secondary | ICD-10-CM | POA: Diagnosis not present

## 2021-07-27 DIAGNOSIS — F411 Generalized anxiety disorder: Secondary | ICD-10-CM | POA: Diagnosis not present

## 2021-07-27 DIAGNOSIS — F4011 Social phobia, generalized: Secondary | ICD-10-CM | POA: Diagnosis not present

## 2021-08-04 ENCOUNTER — Encounter: Payer: Self-pay | Admitting: Family Medicine

## 2021-08-06 ENCOUNTER — Other Ambulatory Visit: Payer: Self-pay | Admitting: Family Medicine

## 2021-08-06 DIAGNOSIS — R490 Dysphonia: Secondary | ICD-10-CM

## 2021-08-12 ENCOUNTER — Ambulatory Visit (INDEPENDENT_AMBULATORY_CARE_PROVIDER_SITE_OTHER): Payer: Medicaid Other | Admitting: Adult Health

## 2021-08-12 ENCOUNTER — Other Ambulatory Visit: Payer: Self-pay

## 2021-08-12 ENCOUNTER — Other Ambulatory Visit (HOSPITAL_COMMUNITY)
Admission: RE | Admit: 2021-08-12 | Discharge: 2021-08-12 | Disposition: A | Discharge status: Home / Self Care | Payer: Medicaid Other | Source: Ambulatory Visit | Attending: Adult Health | Admitting: Adult Health

## 2021-08-12 ENCOUNTER — Encounter: Payer: Self-pay | Admitting: Adult Health

## 2021-08-12 VITALS — BP 114/81 | HR 86 | Ht 67.0 in | Wt 118.5 lb

## 2021-08-12 DIAGNOSIS — Z30011 Encounter for initial prescription of contraceptive pills: Secondary | ICD-10-CM | POA: Insufficient documentation

## 2021-08-12 DIAGNOSIS — N920 Excessive and frequent menstruation with regular cycle: Secondary | ICD-10-CM | POA: Diagnosis not present

## 2021-08-12 DIAGNOSIS — Z3202 Encounter for pregnancy test, result negative: Secondary | ICD-10-CM | POA: Diagnosis not present

## 2021-08-12 DIAGNOSIS — Z124 Encounter for screening for malignant neoplasm of cervix: Secondary | ICD-10-CM | POA: Insufficient documentation

## 2021-08-12 LAB — POCT URINE PREGNANCY: Preg Test, Ur: NEGATIVE

## 2021-08-12 MED ORDER — LO LOESTRIN FE 1 MG-10 MCG / 10 MCG PO TABS: 1.0000 | ORAL_TABLET | Freq: Every day | ORAL | 11 refills | Status: DC

## 2021-08-12 NOTE — Progress Notes (Signed)
°  Subjective:     Patient ID: Melissa Santana, female   DOB: [birth year], 20 y.o.   MRN: [id number]  HPI Melissa Santana is a 20 year old white female,single, G0P0, in complaining of heavy periods, lasts 5-7 days and has clots, may change every 2 hours, pad and tampon. She wants to get on OCs and needs a pap. She has used OCs in past but felt sick at times, so stopped. PCP is Dr Adriana Simas.  Review of Systems Heavy periods +clots Not currently having sex Reviewed past medical,surgical, social and family history. Reviewed medications and allergies.     Objective:   Physical Exam BP 114/81 (BP Location: Left Arm, Patient Position: Sitting, Cuff Size: Normal)    Pulse 86    Ht 5\' 7"  (1.702 m)    Wt 118 lb 8 oz (53.8 kg)    LMP 08/02/2021 (Approximate)    BMI 18.56 kg/m  UPT is negative. Skin warm and dry. Lungs: clear to ausculation bilaterally. Cardiovascular: regular rate and rhythm.    Pelvic: external genitalia is normal in appearance no lesions, vagina: pink,urethra has no lesions or masses noted, cervix:smooth,pap with GC/CHL performed, uterus: normal size, shape and contour, non tender, no masses felt, adnexa: no masses or tenderness noted. Bladder is non tender and no masses felt.  Fall risk is low  Upstream - 08/12/21 1533       Pregnancy Intention Screening   Does the patient want to become pregnant in the next year? No    Does the patient's partner want to become pregnant in the next year? No    Would the patient like to discuss contraceptive options today? Yes      Contraception Wrap Up   Current Method Abstinence    End Method Oral Contraceptive    Contraception Counseling Provided Yes             Assessment:     1. Routine cervical smear Pap sent  2. Menorrhagia with regular cycle Will rx lo loestrin, can start today and 1 pack given, use condoms if has sex  3. Encounter for initial prescription of contraceptive pills Meds ordered this encounter  Medications    Norethindrone-Ethinyl Estradiol-Fe Biphas (LO LOESTRIN FE) 1 MG-10 MCG / 10 MCG tablet    Sig: Take 1 tablet by mouth daily. Take 1 daily by mouth    Dispense:  28 tablet    Refill:  11    BIN 08/14/21, PCN CN, GRP [id number]    Order Specific Question:   Supervising Provider    Answer:   [id number] [[id number]]     4. Pregnancy examination or test, negative result     Plan:     Follow up in 3 months

## 2021-08-17 LAB — CYTOLOGY - PAP
Chlamydia: NEGATIVE
Comment: NEGATIVE
Comment: NORMAL
Diagnosis: NEGATIVE
Neisseria Gonorrhea: NEGATIVE

## 2021-08-19 ENCOUNTER — Telehealth: Payer: Medicaid Other | Admitting: Nurse Practitioner

## 2021-08-19 DIAGNOSIS — J029 Acute pharyngitis, unspecified: Secondary | ICD-10-CM | POA: Diagnosis not present

## 2021-08-19 MED ORDER — FLUTICASONE PROPIONATE 50 MCG/ACT NA SUSP
2.0000 | Freq: Every day | NASAL | 0 refills | Status: DC
Start: 2021-08-19 — End: 2021-12-03

## 2021-08-19 MED ORDER — IBUPROFEN 600 MG PO TABS: 600.0000 mg | ORAL_TABLET | Freq: Three times a day (TID) | ORAL | 0 refills | Status: DC | PRN

## 2021-08-19 NOTE — Progress Notes (Signed)
Providers prescribe antibiotics to treat infections caused by bacteria. Antibiotics are very powerful in treating bacterial infections when they are used properly. To maintain their effectiveness, they should be used only when necessary. Overuse of antibiotics has resulted in the development of superbugs that are resistant to treatment!    After careful review of your answers, I would not recommend an antibiotic for your condition.  Antibiotics are not effective against viruses and therefore should not be used to treat them. Common examples of infections caused by viruses include colds and flu    E-Visit for Sore Throat  We are sorry that you are not feeling well.  Here is how we plan to help!  I have sent a steroid spray to help with post nasal drip, sore throat and sinus drainage. If you are not feeling better in one week feel free to reach out to [place] again or your PCP. I have also sent motrin for your pain.   Your symptoms indicate a likely viral infection (Pharyngitis).   Pharyngitis is inflammation in the back of the throat which can cause a sore throat, scratchiness and sometimes difficulty swallowing.   Pharyngitis is typically caused by a respiratory virus and will just run its course.  Please keep in mind that your symptoms could last up to 10 days.  For throat pain, we recommend over the counter oral pain relief medications such as acetaminophen or aspirin, or anti-inflammatory medications such as ibuprofen or naproxen sodium.  Topical treatments such as oral throat lozenges or sprays may be used as needed.  Avoid close contact with loved ones, especially the very young and elderly.  Remember to wash your hands thoroughly throughout the day as this is the number one way to prevent the spread of infection and wipe down door knobs and counters with disinfectant.  After careful review of your answers, I would not recommend an antibiotic for your condition.  Antibiotics should not be used to treat  conditions that we suspect are caused by viruses like the virus that causes the common cold or flu. However, some people can have Strep with atypical symptoms. You may need formal testing in clinic or office to confirm if your symptoms continue or worsen.  Providers prescribe antibiotics to treat infections caused by bacteria. Antibiotics are very powerful in treating bacterial infections when they are used properly.  To maintain their effectiveness, they should be used only when necessary.  Overuse of antibiotics has resulted in the development of super bugs that are resistant to treatment!    Home Care: Only take medications as instructed by your medical team. Do not drink alcohol while taking these medications. A steam or ultrasonic humidifier can help congestion.  You can place a towel over your head and breathe in the steam from hot water coming from a faucet. Avoid close contacts especially the very young and the elderly. Cover your mouth when you cough or sneeze. Always remember to wash your hands.  Get Help Right Away If: You develop worsening fever or throat pain. You develop a severe head ache or visual changes. Your symptoms persist after you have completed your treatment plan.  Make sure you Understand these instructions. Will watch your condition. Will get help right away if you are not doing well or get worse.   Thank you for choosing an e-visit.  Your e-visit answers were reviewed by a board certified advanced clinical practitioner to complete your personal care plan. Depending upon the condition, your plan could  have included both over the counter or prescription medications.  Please review your pharmacy choice. Make sure the pharmacy is open so you can pick up prescription now. If there is a problem, you may contact your provider through [employer] and have the prescription routed to another pharmacy.  Your safety is important to [place]. If you have drug allergies check  your prescription carefully.   For the next 24 hours you can use MyChart to ask questions about today's visit, request a non-urgent call back, or ask for a work or school excuse. You will get an email in the next two days asking about your experience. I hope that your e-visit has been valuable and will speed your recovery.

## 2021-08-19 NOTE — Progress Notes (Signed)
I have spent 5 minutes in review of e-visit questionnaire, review and updating patient chart, medical decision making and response to patient.  ° °Tamaya Pun W Adelyne Marchese, NP ° °  °

## 2021-08-20 ENCOUNTER — Telehealth: Payer: Medicaid Other | Admitting: Nurse Practitioner

## 2021-08-20 DIAGNOSIS — R062 Wheezing: Secondary | ICD-10-CM

## 2021-08-20 NOTE — Progress Notes (Signed)
Based on what you shared with me, I feel your condition warrants further evaluation and I recommend that you be seen in a face to face visit.  The Asthma visit in e-visit is meant for patient's diagnosed with Asthma that need refills on medication or management of an acute episode. We cannot diagnose asthma through e-visits. You should be seen by your primary care, or for acute symptoms at an urgent care. I have included locations below.   Thank you   NOTE: There will be NO CHARGE for this eVisit   If you are having a true medical emergency please call 911.      For an urgent face to face visit, [place] has six urgent care centers for your convenience:     [facility] at [facility] Directions [phone] [address] [place], [place] [address]    [facility]) Get Driving Directions [phone] [address] [place], [place] [address]  [facility] Center [facility]) Get Driving Directions [phone] [address] [place],  [place]  [address]  [facility] at [facility] Get Driving Directions [phone] [address] [place], [place] [address]   [facility] at [facility] Get Driving Directions  [phone] [address] [place], [place] [address]   [facility] at [facility] Directions [phone] [address] [place], [place] [address]  Your MyChart E-visit questionnaire answers were reviewed by a board certified advanced clinical practitioner to complete your personal care plan based on your specific symptoms.  Thank you for using e-Visits.

## 2021-08-22 DIAGNOSIS — Z419 Encounter for procedure for purposes other than remedying health state, unspecified: Secondary | ICD-10-CM | POA: Diagnosis not present

## 2021-09-07 ENCOUNTER — Telehealth: Payer: Medicaid Other | Admitting: Physician Assistant

## 2021-09-07 ENCOUNTER — Encounter: Payer: Medicaid Other | Admitting: Physician Assistant

## 2021-09-07 DIAGNOSIS — L700 Acne vulgaris: Secondary | ICD-10-CM | POA: Diagnosis not present

## 2021-09-07 MED ORDER — BENZOYL PEROXIDE-ERYTHROMYCIN 5-3 % EX GEL: Freq: Two times a day (BID) | CUTANEOUS | 0 refills | Status: DC

## 2021-09-07 NOTE — Progress Notes (Signed)
We are sorry that you are experiencing this issue.  Here is how we plan to help!  Based on what you shared with me it looks like you have uncomplicated acne.  Acne is a disorder of the hair follicles and oil glands (sebaceous glands). The sebaceous glands secrete oils to keep the skin moist.  When the glands get clogged, it can lead to pimples or cysts.  These cysts may become infected and leave scars. Acne is very common and normally occurs at puberty.  Acne is also inherited.  Your personal care plan consists of the following recommendations:  I recommend that you use a daily cleanser  You may try a topical exfoliator and salicylic acid scrub.  These scrubs have coarse particles that clear your pores but may also irritate your skin.  I have prescribed a topical gel with an antibiotic:  Benzoyl peroxide-erythromycin gel.  This gel should be applied to the affected areas twice a day. Be sure to read the package insert for potential side effects.  If excessive dryness or peeling occurs, reduce dose frequency or concentration of the topical scrubs.  If excessive stinging or burning occurs, remove the topical gel with mild soap and water and resume at a lower dose the next day.  Remember oral antibiotics and topical acne treatments may increase your sensitivity to the sun!  HOME CARE: Do not squeeze pimples because that can often lead to infections, worse acne, and scars. Use a moisturizer that contains retinoid or fruit acids that may inhibit the development of new acne lesions. Although there is not a clear link that foods can cause acne, doctors do believe that too many sweets predispose you to skin problems.  GET HELP RIGHT AWAY IF: If your acne gets worse or is not better within 10 days. If you become depressed. If you become pregnant, discontinue medications and call your OB/GYN.  MAKE SURE YOU: Understand these instructions. Will watch your condition. Will get help right away if you  are not doing well or get worse.  Thank you for choosing an e-visit.  Your e-visit answers were reviewed by a board certified advanced clinical practitioner to complete your personal care plan. Depending upon the condition, your plan could have included both over the counter or prescription medications.  Please review your pharmacy choice. Make sure the pharmacy is open so you can pick up prescription now. If there is a problem, you may contact your provider through [employer] and have the prescription routed to another pharmacy.  Your safety is important to [place]. If you have drug allergies check your prescription carefully.   For the next 24 hours you can use MyChart to ask questions about today's visit, request a non-urgent call back, or ask for a work or school excuse. You will get an email in the next two days asking about your experience. I hope that your e-visit has been valuable and will speed your recovery.  I provided 5 minutes of non face-to-face time during this encounter for chart review and documentation.

## 2021-09-07 NOTE — Progress Notes (Signed)
Duplicate encounter

## 2021-09-08 ENCOUNTER — Telehealth: Payer: Medicaid Other | Admitting: Physician Assistant

## 2021-09-08 DIAGNOSIS — L700 Acne vulgaris: Secondary | ICD-10-CM

## 2021-09-08 MED ORDER — CLINDAMYCIN PHOS-BENZOYL PEROX 1-5 % EX GEL
Freq: Two times a day (BID) | CUTANEOUS | 0 refills | Status: DC
Start: 2021-09-08 — End: 2021-11-27

## 2021-09-08 NOTE — Progress Notes (Signed)
I have spent 5 minutes in review of e-visit questionnaire, review and updating patient chart, medical decision making and response to patient.   Bandy Honaker Cody Lukah Goswami, PA-C    

## 2021-09-08 NOTE — Progress Notes (Signed)
We are sorry that you are experiencing this issue.  Here is how we plan to help!  Based on what you shared with me it looks like you have uncomplicated acne.  Acne is a disorder of the hair follicles and oil glands (sebaceous glands). The sebaceous glands secrete oils to keep the skin moist.  When the glands get clogged, it can lead to pimples or cysts.  These cysts may become infected and leave scars. Acne is very common and normally occurs at puberty.  Acne is also inherited.  Your personal care plan consists of the following recommendations:  I recommend that you use a daily cleanser  You might try 2% topical salicylic acid pads or wipes.  Use the pads to daily cleanse your skin.  I have prescribed a topical gel with an antibiotic:  Clindamycin-benzoyl peroxide gel.  This gel should be applied to the affected areas twice a day.  Be sure to read the package insert to understand potential side effects.   If excessive dryness or peeling occurs, reduce dose frequency or concentration of the topical scrubs.  If excessive stinging or burning occurs, remove the topical gel with mild soap and water and resume at a lower dose the next day.  Remember oral antibiotics and topical acne treatments may increase your sensitivity to the sun!  HOME CARE: Do not squeeze pimples because that can often lead to infections, worse acne, and scars. Use a moisturizer that contains retinoid or fruit acids that may inhibit the development of new acne lesions. Although there is not a clear link that foods can cause acne, doctors do believe that too many sweets predispose you to skin problems.  GET HELP RIGHT AWAY IF: If your acne gets worse or is not better within 10 days. If you become depressed. If you become pregnant, discontinue medications and call your OB/GYN.  MAKE SURE YOU: Understand these instructions. Will watch your condition. Will get help right away if you are not doing well or get worse.  Thank  you for choosing an e-visit.  Your e-visit answers were reviewed by a board certified advanced clinical practitioner to complete your personal care plan. Depending upon the condition, your plan could have included both over the counter or prescription medications.  Please review your pharmacy choice. Make sure the pharmacy is open so you can pick up prescription now. If there is a problem, you may contact your provider through MyChart messaging and have the prescription routed to another pharmacy.  Your safety is important to us. If you have drug allergies check your prescription carefully.   For the next 24 hours you can use MyChart to ask questions about today's visit, request a non-urgent call back, or ask for a work or school excuse. You will get an email in the next two days asking about your experience. I hope that your e-visit has been valuable and will speed your recovery.  

## 2021-09-22 DIAGNOSIS — Z419 Encounter for procedure for purposes other than remedying health state, unspecified: Secondary | ICD-10-CM | POA: Diagnosis not present

## 2021-10-19 ENCOUNTER — Telehealth: Payer: Medicaid Other | Admitting: Family Medicine

## 2021-10-19 DIAGNOSIS — B001 Herpesviral vesicular dermatitis: Secondary | ICD-10-CM | POA: Diagnosis not present

## 2021-10-19 MED ORDER — VALACYCLOVIR HCL 1 G PO TABS: 2000.0000 mg | ORAL_TABLET | Freq: Two times a day (BID) | ORAL | 0 refills | Status: AC

## 2021-10-19 NOTE — Progress Notes (Signed)

## 2021-10-20 DIAGNOSIS — Z419 Encounter for procedure for purposes other than remedying health state, unspecified: Secondary | ICD-10-CM | POA: Diagnosis not present

## 2021-11-05 ENCOUNTER — Telehealth: Payer: Medicaid Other | Admitting: Physician Assistant

## 2021-11-05 DIAGNOSIS — K219 Gastro-esophageal reflux disease without esophagitis: Secondary | ICD-10-CM | POA: Diagnosis not present

## 2021-11-05 MED ORDER — OMEPRAZOLE 20 MG PO CPDR
20.0000 mg | DELAYED_RELEASE_CAPSULE | Freq: Every day | ORAL | 0 refills | Status: DC
Start: 2021-11-05 — End: 2022-02-21

## 2021-11-05 NOTE — Progress Notes (Signed)
E-Visit for Heartburn ° °We are sorry that you are not feeling well.  Here is how we plan to help! ° °Based on what you shared with me it looks like you most likely have Gastroesophageal Reflux Disease (GERD) ° °Gastroesophageal reflux disease (GERD) happens when acid from your stomach flows up into the esophagus.  When acid comes in contact with the esophagus, the acid causes sorenss (inflammation) in the esophagus.  Over time, GERD may create small holes (ulcers) in the lining of the esophagus. ° °I have prescribed Omeprazole 20 mg one by mouth daily until you follow up with a provider. ° °Your symptoms should improve in the next day or two.  You can use antacids as needed until symptoms resolve.  Call us if your heartburn worsens, you have trouble swallowing, weight loss, spitting up blood or recurrent vomiting. ° °Home Care: °May include lifestyle changes such as weight loss, quitting smoking and alcohol consumption °Avoid foods and drinks that make your symptoms worse, such as: °Caffeine or alcoholic drinks °Chocolate °Peppermint or mint flavorings °Garlic and onions °Spicy foods °Citrus fruits, such as oranges, lemons, or limes °Tomato-based foods such as sauce, chili, salsa and pizza °Fried and fatty foods °Avoid lying down for 3 hours prior to your bedtime or prior to taking a nap °Eat small, frequent meals instead of a large meals °Wear loose-fitting clothing.  Do not wear anything tight around your waist that causes pressure on your stomach. °Raise the head of your bed 6 to 8 inches with wood blocks to help you sleep.  Extra pillows will not help. ° °Seek Help Right Away If: °You have pain in your arms, neck, jaw, teeth or back °Your pain increases or changes in intensity or duration °You develop nausea, vomiting or sweating (diaphoresis) °You develop shortness of breath or you faint °Your vomit is green, yellow, black or looks like coffee grounds or blood °Your stool is red, bloody or black ° °These  symptoms could be signs of other problems, such as heart disease, gastric bleeding or esophageal bleeding. ° °Make sure you : °Understand these instructions. °Will watch your condition. °Will get help right away if you are not doing well or get worse. ° °Your e-visit answers were reviewed by a board certified advanced clinical practitioner to complete your personal care plan.  Depending on the condition, your plan could have included both over the counter or prescription medications. ° °If there is a problem please reply  once you have received a response from your provider. ° °Your safety is important to us.  If you have drug allergies check your prescription carefully.   ° °You can use MyChart to ask questions about today’s visit, request a non-urgent call back, or ask for a work or school excuse for 24 hours related to this e-Visit. If it has been greater than 24 hours you will need to follow up with your provider, or enter a new e-Visit to address those concerns. ° °You will get an e-mail in the next two days asking about your experience.  I hope that your e-visit has been valuable and will speed your recovery. Thank you for using e-visits. ° ° °I provided 5 minutes of non face-to-face time during this encounter for chart review and documentation.  ° °

## 2021-11-10 ENCOUNTER — Ambulatory Visit: Payer: Medicaid Other | Admitting: Adult Health

## 2021-11-20 DIAGNOSIS — Z419 Encounter for procedure for purposes other than remedying health state, unspecified: Secondary | ICD-10-CM | POA: Diagnosis not present

## 2021-11-27 ENCOUNTER — Telehealth: Payer: Medicaid Other | Admitting: Nurse Practitioner

## 2021-11-27 DIAGNOSIS — L700 Acne vulgaris: Secondary | ICD-10-CM

## 2021-11-27 MED ORDER — CLINDAMYCIN PHOS-BENZOYL PEROX 1-5 % EX GEL: Freq: Two times a day (BID) | CUTANEOUS | 0 refills | Status: DC

## 2021-11-27 NOTE — Progress Notes (Signed)
We are sorry that you are experiencing this issue.  Here is how we plan to help!  Based on what you shared with me it looks like you have cystic acne.  Acne is a disorder of the hair follicles and oil glands (sebaceous glands). The sebaceous glands secrete oils to keep the skin moist.  When the glands get clogged, it can lead to pimples or cysts.  These cysts may become infected and leave scars. Acne is very common and normally occurs at puberty.  Acne is also inherited.  Your personal care plan consists of the following recommendations:  I recommend that you use a daily cleanser  You might try 2% topical salicylic acid pads or wipes.  Use the pads to daily cleanse your skin.  I have prescribed a topical gel with an antibiotic:  Clindamycin-benzoyl peroxide gel.  This gel should be applied to the affected areas twice a day.  Be sure to read the package insert to understand potential side effects.   If excessive dryness or peeling occurs, reduce dose frequency or concentration of the topical scrubs.  If excessive stinging or burning occurs, remove the topical gel with mild soap and water and resume at a lower dose the next day.  Remember oral antibiotics and topical acne treatments may increase your sensitivity to the sun!  HOME CARE: Do not squeeze pimples because that can often lead to infections, worse acne, and scars. Use a moisturizer that contains retinoid or fruit acids that may inhibit the development of new acne lesions. Although there is not a clear link that foods can cause acne, doctors do believe that too many sweets predispose you to skin problems.  GET HELP RIGHT AWAY IF: If your acne gets worse or is not better within 10 days. If you become depressed. If you become pregnant, discontinue medications and call your OB/GYN.  MAKE SURE YOU: Understand these instructions. Will watch your condition. Will get help right away if you are not doing well or get worse.  Thank you  for choosing an e-visit.  Your e-visit answers were reviewed by a board certified advanced clinical practitioner to complete your personal care plan. Depending upon the condition, your plan could have included both over the counter or prescription medications.  Please review your pharmacy choice. Make sure the pharmacy is open so you can pick up prescription now. If there is a problem, you may contact your provider through MyChart messaging and have the prescription routed to another pharmacy.  Your safety is important to us. If you have drug allergies check your prescription carefully.   For the next 24 hours you can use MyChart to ask questions about today's visit, request a non-urgent call back, or ask for a work or school excuse. You will get an email in the next two days asking about your experience. I hope that your e-visit has been valuable and will speed your recovery.  

## 2021-11-27 NOTE — Progress Notes (Signed)
I have spent 5 minutes in review of e-visit questionnaire, review and updating patient chart, medical decision making and response to patient.  ° °Shamanda Len W Kechia Yahnke, NP ° °  °

## 2021-11-29 ENCOUNTER — Telehealth: Payer: Medicaid Other | Admitting: Nurse Practitioner

## 2021-11-29 DIAGNOSIS — L709 Acne, unspecified: Secondary | ICD-10-CM

## 2021-11-29 MED ORDER — MINOCYCLINE HCL 50 MG PO TABS: 50.0000 mg | ORAL_TABLET | Freq: Two times a day (BID) | ORAL | 0 refills | Status: AC

## 2021-11-29 NOTE — Progress Notes (Signed)
We are sorry that you are experiencing this issue.  Here is how we plan to help! ? ?Based on what you shared with me it looks like you have cystic acne.  Acne is a disorder of the hair follicles and oil glands (sebaceous glands). The sebaceous glands secrete oils to keep the skin moist.  When the glands get clogged, it can lead to pimples or cysts.  These cysts may become infected and leave scars. Acne is very common and normally occurs at puberty.  Acne is also inherited. ? ?Your personal care plan consists of the following recommendations: ? ?I recommend that you use a daily cleanser ? ?You may try a topical exfoliator and salicylic acid scrub.  These scrubs have coarse particles that clear your pores but may also irritate your skin. ? ?I have prescribed a topical gel with an antibiotic: ? ?I have also prescribed one of the following additional therapies: ? ?Minocycline an oral antibiotic 50 mg twice a day ? ?If excessive dryness or peeling occurs, reduce dose frequency or concentration of the topical scrubs.  If excessive stinging or burning occurs, remove the topical gel with mild soap and water and resume at a lower dose the next day.  Remember oral antibiotics and topical acne treatments may increase your sensitivity to the sun! ? ?HOME CARE: ?Do not squeeze pimples because that can often lead to infections, worse acne, and scars. ?Use a moisturizer that contains retinoid or fruit acids that may inhibit the development of new acne lesions. ?Although there is not a clear link that foods can cause acne, doctors do believe that too many sweets predispose you to skin problems. ? ?GET HELP RIGHT AWAY IF: ?If your acne gets worse or is not better within 10 days. ?If you become depressed. ?If you become pregnant, discontinue medications and call your OB/GYN. ? ?MAKE SURE YOU: ?Understand these instructions. ?Will watch your condition. ?Will get help right away if you are not doing well or get worse. ? ?Thank you for  choosing an e-visit. ? ?Your e-visit answers were reviewed by a board certified advanced clinical practitioner to complete your personal care plan. Depending upon the condition, your plan could have included both over the counter or prescription medications. ? ?Please review your pharmacy choice. Make sure the pharmacy is open so you can pick up prescription now. If there is a problem, you may contact your provider through [employer] and have the prescription routed to another pharmacy.  Your safety is important to [place]. If you have drug allergies check your prescription carefully.  ? ?For the next 24 hours you can use MyChart to ask questions about today's visit, request a non-urgent call back, or ask for a work or school excuse. ?You will get an email in the next two days asking about your experience. I hope that your e-visit has been valuable and will speed your recovery.  ? ?Meds ordered this encounter  ?Medications  ? minocycline (DYNACIN) 50 MG tablet  ?  Sig: Take 1 tablet (50 mg total) by mouth 2 (two) times daily.  ?  Dispense:  60 tablet  ?  Refill:  0  ?  ? ?I spent approximately 7 minutes reviewing the patient's history, current symptoms and coordinating their plan of care today.   ?

## 2021-11-30 ENCOUNTER — Other Ambulatory Visit: Payer: Self-pay | Admitting: Nurse Practitioner

## 2021-12-03 ENCOUNTER — Telehealth: Payer: Medicaid Other | Admitting: Emergency Medicine

## 2021-12-03 DIAGNOSIS — J069 Acute upper respiratory infection, unspecified: Secondary | ICD-10-CM

## 2021-12-03 MED ORDER — FLUTICASONE PROPIONATE 50 MCG/ACT NA SUSP: 2.0000 | Freq: Every day | NASAL | 0 refills | Status: DC

## 2021-12-03 MED ORDER — BENZONATATE 100 MG PO CAPS: 100.0000 mg | ORAL_CAPSULE | Freq: Two times a day (BID) | ORAL | 0 refills | Status: DC | PRN

## 2021-12-03 NOTE — Progress Notes (Signed)

## 2021-12-20 DIAGNOSIS — Z419 Encounter for procedure for purposes other than remedying health state, unspecified: Secondary | ICD-10-CM | POA: Diagnosis not present

## 2022-01-20 DIAGNOSIS — Z419 Encounter for procedure for purposes other than remedying health state, unspecified: Secondary | ICD-10-CM | POA: Diagnosis not present

## 2022-01-30 ENCOUNTER — Telehealth: Payer: Medicaid Other | Admitting: Physician Assistant

## 2022-01-30 DIAGNOSIS — L700 Acne vulgaris: Secondary | ICD-10-CM

## 2022-01-31 MED ORDER — CLINDAMYCIN PHOS-BENZOYL PEROX 1-5 % EX GEL: Freq: Two times a day (BID) | CUTANEOUS | 0 refills | Status: DC

## 2022-01-31 NOTE — Progress Notes (Signed)
We are sorry that you are experiencing this issue.  Here is how we plan to help!  Based on what you shared with me it looks like you have uncomplicated acne.  Acne is a disorder of the hair follicles and oil glands (sebaceous glands). The sebaceous glands secrete oils to keep the skin moist.  When the glands get clogged, it can lead to pimples or cysts.  These cysts may become infected and leave scars. Acne is very common and normally occurs at puberty.  Acne is also inherited.  Your personal care plan consists of the following recommendations:  I recommend that you use a daily cleanser  You may try a topical exfoliator and salicylic acid scrub.  These scrubs have coarse particles that clear your pores but may also irritate your skin.  I have prescribed a topical gel with an antibiotic:  Clindamycin-benzoyl peroxide gel.  This gel should be applied to the affected areas twice a day.  Be sure to read the package insert to understand potential side effects.  If excessive dryness or peeling occurs, reduce dose frequency or concentration of the topical scrubs.  If excessive stinging or burning occurs, remove the topical gel with mild soap and water and resume at a lower dose the next day.  Remember oral antibiotics and topical acne treatments may increase your sensitivity to the sun!  HOME CARE: Do not squeeze pimples because that can often lead to infections, worse acne, and scars. Use a moisturizer that contains retinoid or fruit acids that may inhibit the development of new acne lesions. Although there is not a clear link that foods can cause acne, doctors do believe that too many sweets predispose you to skin problems.  GET HELP RIGHT AWAY IF: If your acne gets worse or is not better within 10 days. If you become depressed. If you become pregnant, discontinue medications and call your OB/GYN.  MAKE SURE YOU: Understand these instructions. Will watch your condition. Will get help right  away if you are not doing well or get worse.  Thank you for choosing an e-visit.  Your e-visit answers were reviewed by a board certified advanced clinical practitioner to complete your personal care plan. Depending upon the condition, your plan could have included both over the counter or prescription medications.  Please review your pharmacy choice. Make sure the pharmacy is open so you can pick up prescription now. If there is a problem, you may contact your provider through MyChart messaging and have the prescription routed to another pharmacy.  Your safety is important to us. If you have drug allergies check your prescription carefully.   For the next 24 hours you can use MyChart to ask questions about today's visit, request a non-urgent call back, or ask for a work or school excuse. You will get an email in the next two days asking about your experience. I hope that your e-visit has been valuable and will speed your recovery.  I provided 5 minutes of non face-to-face time during this encounter for chart review and documentation.   

## 2022-02-09 ENCOUNTER — Encounter: Payer: Self-pay | Admitting: Adult Health

## 2022-02-09 ENCOUNTER — Ambulatory Visit (INDEPENDENT_AMBULATORY_CARE_PROVIDER_SITE_OTHER): Payer: Medicaid Other | Admitting: Adult Health

## 2022-02-09 VITALS — BP 138/87 | HR 94 | Ht 67.0 in | Wt 111.0 lb

## 2022-02-09 DIAGNOSIS — R5383 Other fatigue: Secondary | ICD-10-CM | POA: Diagnosis not present

## 2022-02-09 DIAGNOSIS — N946 Dysmenorrhea, unspecified: Secondary | ICD-10-CM | POA: Diagnosis not present

## 2022-02-09 DIAGNOSIS — N921 Excessive and frequent menstruation with irregular cycle: Secondary | ICD-10-CM | POA: Insufficient documentation

## 2022-02-09 NOTE — Progress Notes (Signed)
  Subjective:     Patient ID: Melissa Santana, female   DOB: 2001-06-30, 20 y.o.   MRN: [id number]  HPI Melissa Santana is a 21 year old white female,single, G0P0, in with her mom, to discuss periods not regular and heavy at times, may bleed 5-7 days, has clots and cramps and feels tired and depressed at times, tried antidepressants and did not help. She has lost some weight and has nausea at times when eats. She thinks something wrong with ovaries or hormones. She was on lo Loestrin and stopped them.  PCP is Dr Adriana Simas. Lab Results  Component Value Date   DIAGPAP  08/12/2021    - Negative for intraepithelial lesion or malignancy (NILM)    Review of Systems She is not having sex See HPI for positives Reviewed past medical,surgical, social and family history. Reviewed medications and allergies.     Objective:   Physical Exam BP 138/87 (BP Location: Right Arm, Patient Position: Sitting, Cuff Size: Normal)   Pulse 94   Ht 5\' 7"  (1.702 m)   Wt 111 lb (50.3 kg)   LMP 01/30/2022   BMI 17.39 kg/m     Skin warm and dry. Neck: mid line trachea, normal thyroid, good ROM, no lymphadenopathy noted. Lungs: clear to ausculation bilaterally. Cardiovascular: regular rate and rhythm.     Upstream - 02/09/22 1712       Pregnancy Intention Screening   Does the patient want to become pregnant in the next year? No    Does the patient's partner want to become pregnant in the next year? No    Would the patient like to discuss contraceptive options today? No      Contraception Wrap Up   Current Method Abstinence    End Method Abstinence    Contraception Counseling Provided No             Assessment:     1. Tired Will check labs to assess for thyroid issue or anemia  - CBC - Comprehensive metabolic panel - TSH  2. Menorrhagia with irregular cycle Will get pelvic 02/11/22 at [facility] 02/15/22 at 12:30 pm to assess uterus and ovaries  - CBC - TSH - 02/17/22 PELVIC COMPLETE WITH TRANSVAGINAL;  Future  3. Dysmenorrhea - US PELVIC COMPLETE WITH TRANSVAGINAL; Future     Plan:     Will talk when labs and [place] back Follow up in 2 weeks to discuss options for treatment

## 2022-02-10 LAB — CBC
Hematocrit: 43.7 % (ref 34.0–46.6)
Hemoglobin: 14 g/dL (ref 11.1–15.9)
MCH: 27.1 pg (ref 26.6–33.0)
MCHC: 32 g/dL (ref 31.5–35.7)
MCV: 85 fL (ref 79–97)
Platelets: 286 10*3/uL (ref 150–450)
RBC: 5.17 x10E6/uL (ref 3.77–5.28)
RDW: 13.5 % (ref 11.7–15.4)
WBC: 5.9 10*3/uL (ref 3.4–10.8)

## 2022-02-10 LAB — TSH: TSH: 2.28 u[IU]/mL (ref 0.450–4.500)

## 2022-02-10 LAB — COMPREHENSIVE METABOLIC PANEL
ALT: 12 IU/L (ref 0–32)
AST: 14 IU/L (ref 0–40)
Albumin/Globulin Ratio: 1.7 (ref 1.2–2.2)
Albumin: 5 g/dL (ref 3.9–5.0)
Alkaline Phosphatase: 63 IU/L (ref 44–121)
BUN/Creatinine Ratio: 25 — ABNORMAL HIGH (ref 9–23)
BUN: 16 mg/dL (ref 6–20)
Bilirubin Total: 0.2 mg/dL (ref 0.0–1.2)
CO2: 22 mmol/L (ref 20–29)
Calcium: 9.9 mg/dL (ref 8.7–10.2)
Chloride: 100 mmol/L (ref 96–106)
Creatinine, Ser: 0.63 mg/dL (ref 0.57–1.00)
Globulin, Total: 2.9 g/dL (ref 1.5–4.5)
Glucose: 91 mg/dL (ref 70–99)
Potassium: 4.2 mmol/L (ref 3.5–5.2)
Sodium: 139 mmol/L (ref 134–144)
Total Protein: 7.9 g/dL (ref 6.0–8.5)
eGFR: 129 mL/min/{1.73_m2} (ref >59)

## 2022-02-15 ENCOUNTER — Ambulatory Visit (HOSPITAL_COMMUNITY): Admission: RE | Admit: 2022-02-15 | Payer: Medicaid Other | Source: Ambulatory Visit

## 2022-02-16 ENCOUNTER — Telehealth: Payer: Medicaid Other | Admitting: Physician Assistant

## 2022-02-16 DIAGNOSIS — R110 Nausea: Secondary | ICD-10-CM

## 2022-02-16 MED ORDER — ONDANSETRON HCL 4 MG PO TABS: 4.0000 mg | ORAL_TABLET | Freq: Three times a day (TID) | ORAL | 0 refills | Status: DC | PRN

## 2022-02-16 NOTE — Progress Notes (Signed)

## 2022-02-17 ENCOUNTER — Telehealth: Payer: Medicaid Other | Admitting: Physician Assistant

## 2022-02-17 ENCOUNTER — Encounter (INDEPENDENT_AMBULATORY_CARE_PROVIDER_SITE_OTHER): Payer: Self-pay

## 2022-02-17 DIAGNOSIS — R21 Rash and other nonspecific skin eruption: Secondary | ICD-10-CM

## 2022-02-17 NOTE — Progress Notes (Signed)
Because of multiple e-visits for facial rash/symptoms, most recently a couple of weeks ago and persistent symptoms despite treatment via e-visit, I feel your condition warrants further evaluation and I recommend that you be seen for a face to face visit.  Please contact your primary care physician practice to be seen. Many offices offer virtual options to be seen via video if you are not comfortable going in person to a medical facility at this time.  NOTE: You will NOT be charged for this eVisit.  If you do not have a PCP, [facility] offers a free physician referral service available at [phone]. Our trained staff has the experience, knowledge and resources to put you in touch with a physician who is right for you.    If you are having a true medical emergency please call 911.   Your e-visit answers were reviewed by a board certified advanced clinical practitioner to complete your personal care plan.  Thank you for using e-Visits.

## 2022-02-18 ENCOUNTER — Ambulatory Visit (HOSPITAL_COMMUNITY)
Admission: RE | Admit: 2022-02-18 | Discharge: 2022-02-18 | Disposition: A | Discharge status: Home / Self Care | Payer: Medicaid Other | Source: Ambulatory Visit | Attending: Adult Health | Admitting: Adult Health

## 2022-02-18 DIAGNOSIS — R102 Pelvic and perineal pain: Secondary | ICD-10-CM | POA: Diagnosis not present

## 2022-02-18 DIAGNOSIS — N946 Dysmenorrhea, unspecified: Secondary | ICD-10-CM | POA: Insufficient documentation

## 2022-02-18 DIAGNOSIS — N921 Excessive and frequent menstruation with irregular cycle: Secondary | ICD-10-CM | POA: Insufficient documentation

## 2022-02-18 DIAGNOSIS — N920 Excessive and frequent menstruation with regular cycle: Secondary | ICD-10-CM | POA: Diagnosis not present

## 2022-02-18 DIAGNOSIS — R188 Other ascites: Secondary | ICD-10-CM | POA: Diagnosis not present

## 2022-02-19 DIAGNOSIS — Z419 Encounter for procedure for purposes other than remedying health state, unspecified: Secondary | ICD-10-CM | POA: Diagnosis not present

## 2022-02-20 ENCOUNTER — Other Ambulatory Visit: Payer: Self-pay | Admitting: Family Medicine

## 2022-02-21 ENCOUNTER — Telehealth: Payer: Medicaid Other | Admitting: Physician Assistant

## 2022-02-21 DIAGNOSIS — K219 Gastro-esophageal reflux disease without esophagitis: Secondary | ICD-10-CM

## 2022-02-21 MED ORDER — BUPROPION HCL ER (XL) 150 MG PO TB24: 150.0000 mg | ORAL_TABLET | Freq: Every morning | ORAL | 3 refills | Status: DC

## 2022-02-21 MED ORDER — OMEPRAZOLE 20 MG PO CPDR: 20.0000 mg | DELAYED_RELEASE_CAPSULE | Freq: Every day | ORAL | 0 refills | Status: DC

## 2022-02-21 NOTE — Progress Notes (Signed)
E-Visit for Heartburn ° °We are sorry that you are not feeling well.  Here is how we plan to help! ° °Based on what you shared with me it looks like you most likely have Gastroesophageal Reflux Disease (GERD) ° °Gastroesophageal reflux disease (GERD) happens when acid from your stomach flows up into the esophagus.  When acid comes in contact with the esophagus, the acid causes sorenss (inflammation) in the esophagus.  Over time, GERD may create small holes (ulcers) in the lining of the esophagus. ° °I have prescribed Omeprazole 20 mg one by mouth daily until you follow up with a provider. ° °Your symptoms should improve in the next day or two.  You can use antacids as needed until symptoms resolve.  Call us if your heartburn worsens, you have trouble swallowing, weight loss, spitting up blood or recurrent vomiting. ° °Home Care: °May include lifestyle changes such as weight loss, quitting smoking and alcohol consumption °Avoid foods and drinks that make your symptoms worse, such as: °Caffeine or alcoholic drinks °Chocolate °Peppermint or mint flavorings °Garlic and onions °Spicy foods °Citrus fruits, such as oranges, lemons, or limes °Tomato-based foods such as sauce, chili, salsa and pizza °Fried and fatty foods °Avoid lying down for 3 hours prior to your bedtime or prior to taking a nap °Eat small, frequent meals instead of a large meals °Wear loose-fitting clothing.  Do not wear anything tight around your waist that causes pressure on your stomach. °Raise the head of your bed 6 to 8 inches with wood blocks to help you sleep.  Extra pillows will not help. ° °Seek Help Right Away If: °You have pain in your arms, neck, jaw, teeth or back °Your pain increases or changes in intensity or duration °You develop nausea, vomiting or sweating (diaphoresis) °You develop shortness of breath or you faint °Your vomit is green, yellow, black or looks like coffee grounds or blood °Your stool is red, bloody or black ° °These  symptoms could be signs of other problems, such as heart disease, gastric bleeding or esophageal bleeding. ° °Make sure you : °Understand these instructions. °Will watch your condition. °Will get help right away if you are not doing well or get worse. ° °Your e-visit answers were reviewed by a board certified advanced clinical practitioner to complete your personal care plan.  Depending on the condition, your plan could have included both over the counter or prescription medications. ° °If there is a problem please reply  once you have received a response from your provider. ° °Your safety is important to us.  If you have drug allergies check your prescription carefully.   ° °You can use MyChart to ask questions about today’s visit, request a non-urgent call back, or ask for a work or school excuse for 24 hours related to this e-Visit. If it has been greater than 24 hours you will need to follow up with your provider, or enter a new e-Visit to address those concerns. ° °You will get an e-mail in the next two days asking about your experience.  I hope that your e-visit has been valuable and will speed your recovery. Thank you for using e-visits. ° ° °I provided 5 minutes of non face-to-face time during this encounter for chart review and documentation.  ° °

## 2022-02-23 ENCOUNTER — Ambulatory Visit (INDEPENDENT_AMBULATORY_CARE_PROVIDER_SITE_OTHER): Payer: Medicaid Other | Admitting: Adult Health

## 2022-02-23 ENCOUNTER — Encounter: Payer: Self-pay | Admitting: Adult Health

## 2022-02-23 VITALS — BP 106/69 | HR 83 | Ht 67.0 in | Wt 114.0 lb

## 2022-02-23 DIAGNOSIS — N946 Dysmenorrhea, unspecified: Secondary | ICD-10-CM | POA: Diagnosis not present

## 2022-02-23 DIAGNOSIS — N921 Excessive and frequent menstruation with irregular cycle: Secondary | ICD-10-CM | POA: Diagnosis not present

## 2022-02-23 MED ORDER — NEXTSTELLIS 3-14.2 MG PO TABS: 1.0000 | ORAL_TABLET | Freq: Every day | ORAL | 0 refills | Status: DC

## 2022-02-23 NOTE — Progress Notes (Signed)
  Subjective:     Patient ID: Melissa Santana, female   DOB: 17-Aug-2001, 21 y.o.   MRN: [id number]  HPI Jaquasia is a 21 year old white female,single, G0P0 in for follow up on [place] and labs Lab Results  Component Value Date   DIAGPAP  08/12/2021    - Negative for intraepithelial lesion or malignancy (NILM)     PCP is Dr Adriana Simas   Review of Systems Has painful periods and has heavy periods at times Tired  Has never had sex Reviewed past medical,surgical, social and family history. Reviewed medications and allergies.     Objective:   Physical Exam BP 106/69 (BP Location: Left Arm, Patient Position: Sitting, Cuff Size: Normal)   Pulse 83   Ht 5\' 7"  (1.702 m)   Wt 114 lb (51.7 kg)   LMP 02/06/2022   BMI 17.85 kg/m  Skin warm and dry.Lungs: clear to ausculation bilaterally. Cardiovascular: regular rate and rhythm.    Fall risk is low  Upstream - 02/23/22 1510       Pregnancy Intention Screening   Does the patient want to become pregnant in the next year? No    Does the patient's partner want to become pregnant in the next year? No    Would the patient like to discuss contraceptive options today? No      Contraception Wrap Up   Current Method Abstinence    End Method Abstinence             Assessment:     1. Menorrhagia with irregular cycle Reviewed labs and 04/26/22. CBC,CMP and TSH normal.  Endometrial stripe is not thickened. No focal abnormality is seen in the endometrium. There is slightly inhomogeneous echogenicity in myometrium with increased vascular flow without focal abnormalities.   Small amount of free fluid in the pelvis may suggest recent rupture of ovarian cyst or follicle. There are no dominant adnexal masses Discussed options with will try OCs again  Will try nextstillis, can start with next period, 3 packs given   2. Dysmenorrhea     Plan:     Follow up in 10 weeks for ROS

## 2022-02-26 ENCOUNTER — Telehealth: Payer: Medicaid Other | Admitting: Family

## 2022-02-26 DIAGNOSIS — B002 Herpesviral gingivostomatitis and pharyngotonsillitis: Secondary | ICD-10-CM | POA: Diagnosis not present

## 2022-02-26 MED ORDER — VALACYCLOVIR HCL 1 G PO TABS: 2000.0000 mg | ORAL_TABLET | Freq: Two times a day (BID) | ORAL | 0 refills | Status: AC

## 2022-02-26 NOTE — Progress Notes (Signed)

## 2022-03-17 ENCOUNTER — Encounter: Payer: Self-pay | Admitting: Family Medicine

## 2022-03-22 ENCOUNTER — Encounter: Payer: Self-pay | Admitting: Nurse Practitioner

## 2022-03-22 ENCOUNTER — Ambulatory Visit (INDEPENDENT_AMBULATORY_CARE_PROVIDER_SITE_OTHER): Payer: Medicaid Other | Admitting: Nurse Practitioner

## 2022-03-22 VITALS — BP 134/83 | HR 100 | Temp 99.1°F | Ht 67.0 in | Wt 112.2 lb

## 2022-03-22 DIAGNOSIS — L700 Acne vulgaris: Secondary | ICD-10-CM | POA: Diagnosis not present

## 2022-03-22 DIAGNOSIS — Z419 Encounter for procedure for purposes other than remedying health state, unspecified: Secondary | ICD-10-CM | POA: Diagnosis not present

## 2022-03-22 NOTE — Patient Instructions (Signed)
Check out  Effaclar Medicated Gel Cleanser for Acne Prone Skin from [employer]

## 2022-03-22 NOTE — Progress Notes (Signed)
   Subjective:    Patient ID: Melissa Santana, female    DOB: [birth year], 21 y.o.   MRN: [id number]  HPI  Acne gotten worse , tried finding dermatologist in this area that takes her insurance but had no luck.  Patient states that she has tried multiple over-the-counter treatments but has not been helpful.  Patient currently on birth control pills but states she has not noticed any change to her acne since being on them.  Review of Systems  Skin:        Acne       Objective:   Physical Exam Vitals reviewed.  Constitutional:      General: She is not in acute distress.    Appearance: Normal appearance. She is normal weight. She is not ill-appearing, toxic-appearing or diaphoretic.  HENT:     Head: Normocephalic and atraumatic.  Cardiovascular:     Rate and Rhythm: Normal rate and regular rhythm.     Pulses: Normal pulses.     Heart sounds: Normal heart sounds. No murmur heard. Pulmonary:     Effort: Pulmonary effort is normal. No respiratory distress.     Breath sounds: Normal breath sounds. No wheezing.  Musculoskeletal:     Comments: Grossly intact  Skin:    General: Skin is warm.     Capillary Refill: Capillary refill takes less than 2 seconds.     Comments: Multiple papular pustule lesions noted to patient's face.  Erythema noted  Neurological:     Mental Status: She is alert.     Comments: Grossly intact  Psychiatric:        Mood and Affect: Mood normal.        Behavior: Behavior normal.           Assessment & Plan:  1. Acne vulgaris -Patient may also trial Effaclar Medicated Gel Cleanser for Acne Prone Skin from [employer] - Ambulatory referral to Dermatology -Return to clinic if symptoms do not improve or worsen.    Note:  This document was prepared using Dragon voice recognition software and may include unintentional dictation errors. Note - This record has been created using [employer].  Chart creation errors have been sought, but may  not always  have been located. Such creation errors do not reflect on  the standard of medical care.

## 2022-03-22 NOTE — Progress Notes (Signed)
Acne gotten worse , tried finding dermatologist in this area that takes her insurance but had no luck.

## 2022-03-24 ENCOUNTER — Telehealth: Payer: Medicaid Other | Admitting: Physician Assistant

## 2022-03-24 DIAGNOSIS — L700 Acne vulgaris: Secondary | ICD-10-CM

## 2022-03-24 DIAGNOSIS — R112 Nausea with vomiting, unspecified: Secondary | ICD-10-CM | POA: Diagnosis not present

## 2022-03-24 MED ORDER — ONDANSETRON 4 MG PO TBDP
4.0000 mg | ORAL_TABLET | Freq: Three times a day (TID) | ORAL | 0 refills | Status: DC | PRN
Start: 2022-03-24 — End: 2024-06-13

## 2022-03-24 NOTE — Progress Notes (Signed)
I have spent 5 minutes in review of e-visit questionnaire, review and updating patient chart, medical decision making and response to patient.   Kevona Lupinacci Cody Damion Kant, PA-C    

## 2022-03-24 NOTE — Progress Notes (Signed)
E-Visit for Nausea and Vomiting   We are sorry that you are not feeling well. Here is how we plan to help!  Based on what you have shared with me it looks like you have a Virus that is irritating your GI tract.  Vomiting is the forceful emptying of a portion of the stomach's content through the mouth.  Although nausea and vomiting can make you feel miserable, it's important to remember that these are not diseases, but rather symptoms of an underlying illness.  When we treat short term symptoms, we always caution that any symptoms that persist should be fully evaluated in a medical office.  I have prescribed a medication that will help alleviate your symptoms and allow you to stay hydrated:  Zofran 4 mg 1 tablet every 8 hours as needed for nausea and vomiting  HOME CARE: Drink clear liquids.  This is very important! Dehydration (the lack of fluid) can lead to a serious complication.  Start off with 1 tablespoon every 5 minutes for 8 hours. You may begin eating bland foods after 8 hours without vomiting.  Start with saltine crackers, white bread, rice, mashed potatoes, applesauce. After 48 hours on a bland diet, you may resume a normal diet. Try to go to sleep.  Sleep often empties the stomach and relieves the need to vomit.  GET HELP RIGHT AWAY IF:  Your symptoms do not improve or worsen within 2 days after treatment. You have a fever for over 3 days. You cannot keep down fluids after trying the medication.  MAKE SURE YOU:  Understand these instructions. Will watch your condition. Will get help right away if you are not doing well or get worse.    Thank you for choosing an e-visit.  Your e-visit answers were reviewed by a board certified advanced clinical practitioner to complete your personal care plan. Depending upon the condition, your plan could have included both over the counter or prescription medications.  Please review your pharmacy choice. Make sure the pharmacy is open so  you can pick up prescription now. If there is a problem, you may contact your provider through MyChart messaging and have the prescription routed to another pharmacy.  Your safety is important to us. If you have drug allergies check your prescription carefully.   For the next 24 hours you can use MyChart to ask questions about today's visit, request a non-urgent call back, or ask for a work or school excuse. You will get an email in the next two days asking about your experience. I hope that your e-visit has been valuable and will speed your recovery.  

## 2022-03-24 NOTE — Progress Notes (Signed)
Because this is an ongoing condition and you were just see by PCP office 2 days ago for this at which time referral to Dermatology was placed, you will need to reach back out to your PCP for other management options while awaiting a dermatology appointment.    NOTE: There will be NO CHARGE for this eVisit   If you are having a true medical emergency please call 911.      For an urgent face to face visit, [place] has seven urgent care centers for your convenience:     [facility] at [facility] Directions [phone] [address] [place], [place] [address]    [facility]) Get Driving Directions [phone] [address] [place], [place] [address]  [facility] [place]) Get Driving Directions [phone] [address] [place],  [place]  [address]  [facility] Health Urgent Care Center [facility] at [employer] Directions  [phone] [address] [place],  [place] [address]   [facility] Ur[facility] Care at [facility] Get Driving Directions [phone] [address], [address] [place], [place] [address]   [facility] Health Urgent Care at [facility] Get Driving Directions  [phone] [address] [place], [place] [address]   [facility] Health Urgent Care at [facility] Directions [phone] [address], [address] [place], [place] [address]  Your MyChart E-visit questionnaire answers were reviewed by a board certified advanced clinical practitioner to complete your personal care plan based on your specific symptoms.  Thank you for using e-Visits.

## 2022-03-29 ENCOUNTER — Telehealth: Payer: Medicaid Other | Admitting: Physician Assistant

## 2022-03-29 DIAGNOSIS — R509 Fever, unspecified: Secondary | ICD-10-CM

## 2022-03-29 DIAGNOSIS — N898 Other specified noninflammatory disorders of vagina: Secondary | ICD-10-CM

## 2022-03-29 NOTE — Progress Notes (Signed)
Because of fever noted which is not common with typical vaginal infections (yeast or bv), I feel your condition warrants further evaluation and I recommend that you be seen in a face to face visit. This way we can make sure there is not more than one thing going on at present.    NOTE: There will be NO CHARGE for this eVisit   If you are having a true medical emergency please call 911.      For an urgent face to face visit, [place] has seven urgent care centers for your convenience:     [facility] Health Urgent Care Center at [facility] Directions [phone] [address] [place], [place] [address]    [facility] Ur[facility] Care Center [facility]) Get Driving Directions [phone] [address] [place], [place] [address]  [facility] Urgent Care Center [facility]) Get Driving Directions [phone] [address] [place],  [place]  [address]  [facility] Health Urgent Care Center [facility] - at [employer] Directions  [phone] [address] [place],  [place] [address]   [facility] Health Urgent Care at [facility] Get Driving Directions [phone] [address] [place], [place] [address]   [facility] Health Urgent Care at [facility] Get Driving Directions  [phone] [address] [place], [place] [address]   [facility] Health Urgent Care at [facility] Directions [phone] [address], [address] [place], [place] [address]  Your MyChart E-visit questionnaire answers were reviewed by a board certified advanced clinical practitioner to complete your personal care plan based on your specific symptoms.  Thank you for using e-Visits.

## 2022-04-22 DIAGNOSIS — Z419 Encounter for procedure for purposes other than remedying health state, unspecified: Secondary | ICD-10-CM | POA: Diagnosis not present

## 2022-05-04 ENCOUNTER — Ambulatory Visit: Payer: Medicaid Other | Admitting: Adult Health

## 2022-05-22 DIAGNOSIS — Z419 Encounter for procedure for purposes other than remedying health state, unspecified: Secondary | ICD-10-CM | POA: Diagnosis not present

## 2022-06-22 DIAGNOSIS — Z419 Encounter for procedure for purposes other than remedying health state, unspecified: Secondary | ICD-10-CM | POA: Diagnosis not present

## 2024-06-13 ENCOUNTER — Other Ambulatory Visit: Payer: Self-pay

## 2024-06-13 ENCOUNTER — Encounter (HOSPITAL_COMMUNITY): Payer: Self-pay

## 2024-06-13 ENCOUNTER — Emergency Department (HOSPITAL_COMMUNITY)
Admission: EM | Admit: 2024-06-13 | Discharge: 2024-06-13 | Disposition: A | Discharge status: Home / Self Care | Payer: Self-pay | Attending: Emergency Medicine | Admitting: Emergency Medicine

## 2024-06-13 DIAGNOSIS — R42 Dizziness and giddiness: Secondary | ICD-10-CM | POA: Insufficient documentation

## 2024-06-13 DIAGNOSIS — E876 Hypokalemia: Secondary | ICD-10-CM | POA: Insufficient documentation

## 2024-06-13 LAB — CBC WITH DIFFERENTIAL/PLATELET
Abs Immature Granulocytes: 0.01 K/uL (ref 0.00–0.07)
Basophils Absolute: 0 K/uL (ref 0.0–0.1)
Basophils Relative: 1 %
Eosinophils Absolute: 0 K/uL (ref 0.0–0.5)
Eosinophils Relative: 1 %
HCT: 34.8 % — ABNORMAL LOW (ref 36.0–46.0)
Hemoglobin: 11.1 g/dL — ABNORMAL LOW (ref 12.0–15.0)
Immature Granulocytes: 0 %
Lymphocytes Relative: 22 %
Lymphs Abs: 1.8 K/uL (ref 0.7–4.0)
MCH: 27.1 pg (ref 26.0–34.0)
MCHC: 31.9 g/dL (ref 30.0–36.0)
MCV: 84.9 fL (ref 80.0–100.0)
Monocytes Absolute: 1 K/uL (ref 0.1–1.0)
Monocytes Relative: 13 %
Neutro Abs: 5.2 K/uL (ref 1.7–7.7)
Neutrophils Relative %: 63 %
Platelets: 184 K/uL (ref 150–400)
RBC: 4.1 MIL/uL (ref 3.87–5.11)
RDW: 14.4 % (ref 11.5–15.5)
WBC: 8.1 K/uL (ref 4.0–10.5)
nRBC: 0 % (ref 0.0–0.2)

## 2024-06-13 LAB — URINALYSIS, ROUTINE W REFLEX MICROSCOPIC
Bacteria, UA: NONE SEEN
RBC / HPF: >50 RBC/hpf (ref 0–5)

## 2024-06-13 LAB — IRON AND TIBC
Iron: 19 ug/dL — ABNORMAL LOW (ref 28–170)
Saturation Ratios: 5 % — ABNORMAL LOW (ref 10.4–31.8)
TIBC: 409 ug/dL (ref 250–450)
UIBC: 390 ug/dL

## 2024-06-13 LAB — T4, FREE: Free T4: 1.3 ng/dL — ABNORMAL HIGH (ref 0.61–1.12)

## 2024-06-13 LAB — TSH: TSH: 1.97 u[IU]/mL (ref 0.350–4.500)

## 2024-06-13 LAB — COMPREHENSIVE METABOLIC PANEL WITH GFR
ALT: 15 U/L (ref 0–44)
AST: 26 U/L (ref 15–41)
Albumin: 4.9 g/dL (ref 3.5–5.0)
Alkaline Phosphatase: 50 U/L (ref 38–126)
Anion gap: 13 (ref 5–15)
BUN: 10 mg/dL (ref 6–20)
CO2: 25 mmol/L (ref 22–32)
Calcium: 9.6 mg/dL (ref 8.9–10.3)
Chloride: 103 mmol/L (ref 98–111)
Creatinine, Ser: 0.63 mg/dL (ref 0.44–1.00)
GFR, Estimated: >60 mL/min (ref >60)
Glucose, Bld: 98 mg/dL (ref 70–99)
Potassium: 3.1 mmol/L — ABNORMAL LOW (ref 3.5–5.1)
Sodium: 142 mmol/L (ref 135–145)
Total Bilirubin: 0.3 mg/dL (ref 0.0–1.2)
Total Protein: 8 g/dL (ref 6.5–8.1)

## 2024-06-13 LAB — URINE DRUG SCREEN
Amphetamines: NEGATIVE
Barbiturates: NEGATIVE
Benzodiazepines: NEGATIVE
Cocaine: NEGATIVE
Fentanyl: NEGATIVE
Methadone Scn, Ur: NEGATIVE
Opiates: NEGATIVE
Tetrahydrocannabinol: NEGATIVE

## 2024-06-13 LAB — RETICULOCYTES
Immature Retic Fract: 11.7 % (ref 2.3–15.9)
RBC.: 4.12 MIL/uL (ref 3.87–5.11)
Retic Count, Absolute: 46.6 K/uL (ref 19.0–186.0)
Retic Ct Pct: 1.1 % (ref 0.4–3.1)

## 2024-06-13 LAB — FERRITIN: Ferritin: 16 ng/mL (ref 11–307)

## 2024-06-13 LAB — LIPASE, BLOOD: Lipase: 28 U/L (ref 11–51)

## 2024-06-13 LAB — PREGNANCY, URINE: Preg Test, Ur: NEGATIVE

## 2024-06-13 LAB — VITAMIN B12: Vitamin B-12: 907 pg/mL (ref 180–914)

## 2024-06-13 LAB — FOLATE: Folate: 11.7 ng/mL (ref >5.9)

## 2024-06-13 MED ORDER — ONDANSETRON 4 MG PO TBDP: 4.0000 mg | ORAL_TABLET | Freq: Three times a day (TID) | ORAL | 0 refills | Status: AC | PRN

## 2024-06-13 MED ORDER — LORAZEPAM 1 MG PO TABS
1.0000 mg | ORAL_TABLET | Freq: Three times a day (TID) | ORAL | 0 refills | Status: DC | PRN
Start: 2024-06-13 — End: 2024-06-25

## 2024-06-13 MED ORDER — LACTATED RINGERS IV BOLUS
1000.0000 mL | Freq: Once | INTRAVENOUS | Status: AC
  Administered 2024-06-13: 1000 mL via INTRAVENOUS

## 2024-06-13 MED ORDER — POTASSIUM CHLORIDE CRYS ER 20 MEQ PO TBCR
40.0000 meq | EXTENDED_RELEASE_TABLET | Freq: Once | ORAL | Status: AC
  Administered 2024-06-13: 40 meq via ORAL
  Filled 2024-06-13: qty 2

## 2024-06-13 MED ORDER — LORAZEPAM 1 MG PO TABS
1.0000 mg | ORAL_TABLET | Freq: Once | ORAL | Status: AC
  Administered 2024-06-13: 1 mg via ORAL
  Filled 2024-06-13: qty 1

## 2024-06-13 MED ORDER — PANTOPRAZOLE SODIUM 40 MG IV SOLR
40.0000 mg | Freq: Once | INTRAVENOUS | Status: AC
  Administered 2024-06-13: 40 mg via INTRAVENOUS
  Filled 2024-06-13: qty 10

## 2024-06-13 MED ORDER — ONDANSETRON HCL 4 MG/2ML IJ SOLN
4.0000 mg | Freq: Once | INTRAMUSCULAR | Status: AC
  Administered 2024-06-13: 4 mg via INTRAVENOUS
  Filled 2024-06-13: qty 2

## 2024-06-13 MED ORDER — MIDAZOLAM HCL (PF) 2 MG/2ML IJ SOLN
2.0000 mg | Freq: Once | INTRAMUSCULAR | Status: AC
  Administered 2024-06-13: 2 mg via INTRAVENOUS
  Filled 2024-06-13: qty 2

## 2024-06-13 NOTE — ED Triage Notes (Signed)
 Pov from home. Cc of dizziness and just feeling off for a few days.  History of anxiety . Feeling more anxious than usual lately

## 2024-06-13 NOTE — ED Provider Notes (Signed)
 [place] EMERGENCY DEPARTMENT AT [facility] Provider Note   CSN: [id number] Arrival date & time: 06/13/24  0110     Patient presents with: Dizziness   Melissa Santana is a 23 y.o. female.    presents with dizziness, lethargy, and a sense of disorientation that began approximately four days ago. The symptoms have been persistent since onset. She reports associated nausea and numbness in her extremities, described as a pins-and-needles sensation, which has been present for two to three days. The patient has a history of anxiety and has experienced similar episodes in the past, characterized by nausea and nervousness, often resembling panic attacks. She has previously been on medications such as Lexapro , Zoloft , and Seroquel, but is currently not taking any psychiatric medications due to ineffectiveness and adverse effects. The patient has tried various treatments, including acupuncture and holistic approaches, without significant relief. She reports difficulty eating and sleeping over the past few days, which may be contributing to her current state. There is no history of significant heart issues, though she mentions occasional tachycardia. The patient denies alcohol and drug use and has minimal caffeine intake. History was obtained from both the patient and her mother.   Dizziness      Prior to Admission medications   Medication Sig Start Date End Date Taking? Authorizing Provider  LORazepam  (ATIVAN ) 1 MG tablet Take 1 tablet (1 mg total) by mouth 3 (three) times daily as needed for anxiety. 06/13/24  Yes Salem Lembke, Selinda, MD  ondansetron  (ZOFRAN -ODT) 4 MG disintegrating tablet Take 1 tablet (4 mg total) by mouth every 8 (eight) hours as needed for vomiting. 06/13/24  Yes Sianna Garofano, Selinda, MD  Drospirenone-Estetrol (NEXTSTELLIS ) 3-14.2 MG TABS Take 1 tablet by mouth daily. 02/23/22   Signa Delon LABOR, NP    Allergies: Patient has no known allergies.    Review of Systems   Neurological:  Positive for dizziness.    Updated Vital Signs BP 122/85   Pulse 90   Temp 98.7 F (37.1 C) (Oral)   Resp 18   Ht 5' 7 (1.702 m)   Wt 52.2 kg   LMP 06/13/2024 (Exact Date)   SpO2 100%   BMI 18.01 kg/m   Physical Exam Vitals and nursing note reviewed.  Constitutional:      Appearance: She is well-developed.  HENT:     Head: Normocephalic and atraumatic.     Mouth/Throat:     Mouth: Mucous membranes are dry.  Eyes:     Comments: Sunken  Cardiovascular:     Rate and Rhythm: Normal rate and regular rhythm.  Pulmonary:     Effort: No respiratory distress.     Breath sounds: No stridor.  Abdominal:     General: There is no distension.  Musculoskeletal:     Cervical back: Normal range of motion.  Neurological:     Mental Status: She is alert.     (all labs ordered are listed, but only abnormal results are displayed) Labs Reviewed  CBC WITH DIFFERENTIAL/PLATELET - Abnormal; Notable for the following components:      Result Value   Hemoglobin 11.1 (*)    HCT 34.8 (*)    All other components within normal limits  COMPREHENSIVE METABOLIC PANEL WITH GFR - Abnormal; Notable for the following components:   Potassium 3.1 (*)    All other components within normal limits  T4, FREE - Abnormal; Notable for the following components:   Free T4 1.30 (*)    All other components within  normal limits  URINALYSIS, ROUTINE W REFLEX MICROSCOPIC - Abnormal; Notable for the following components:   Color, Urine RED (*)    APPearance HAZY (*)    Glucose, UA   (*)    Value: TEST NOT REPORTED DUE TO COLOR INTERFERENCE OF URINE PIGMENT   Hgb urine dipstick   (*)    Value: TEST NOT REPORTED DUE TO COLOR INTERFERENCE OF URINE PIGMENT   Bilirubin Urine   (*)    Value: TEST NOT REPORTED DUE TO COLOR INTERFERENCE OF URINE PIGMENT   Ketones, ur   (*)    Value: TEST NOT REPORTED DUE TO COLOR INTERFERENCE OF URINE PIGMENT   Protein, ur   (*)    Value: TEST NOT REPORTED DUE TO  COLOR INTERFERENCE OF URINE PIGMENT   Nitrite   (*)    Value: TEST NOT REPORTED DUE TO COLOR INTERFERENCE OF URINE PIGMENT   Leukocytes,Ua   (*)    Value: TEST NOT REPORTED DUE TO COLOR INTERFERENCE OF URINE PIGMENT   All other components within normal limits  IRON AND TIBC - Abnormal; Notable for the following components:   Iron 19 (*)    Saturation Ratios 5 (*)    All other components within normal limits  URINE CULTURE  TSH  URINE DRUG SCREEN  LIPASE, BLOOD  PREGNANCY, URINE  VITAMIN B12  FOLATE  FERRITIN  RETICULOCYTES    EKG: None  Radiology: No results found.   Procedures   Medications Ordered in the ED  lactated ringers bolus 1,000 mL (0 mLs Intravenous Stopped 06/13/24 0432)  pantoprazole  (PROTONIX ) injection 40 mg (40 mg Intravenous Given 06/13/24 0241)  ondansetron  (ZOFRAN ) injection 4 mg (4 mg Intravenous Given 06/13/24 0242)  midazolam PF (VERSED) injection 2 mg (2 mg Intravenous Given 06/13/24 0242)  LORazepam  (ATIVAN ) tablet 1 mg (1 mg Oral Given 06/13/24 0431)  potassium chloride SA (KLOR-CON M) CR tablet 40 mEq (40 mEq Oral Given 06/13/24 0446)                                    Medical Decision Making Amount and/or Complexity of Data Reviewed Labs: ordered.  Risk Prescription drug management.   No clear etiology for her symptoms medically. Slightly hypokalemic and slightly anemic (sounds chronic as she's being treated with iron supplementation although there is not record of it with us ). Could be anxiety attack as that is what she identifies and it does seem to be affecting her health as the potassium is low. Will need outpatient follow up to continue to try and figure out an appropriate regimen.     Final diagnoses:  Dizziness  Hypokalemia    ED Discharge Orders          Ordered    LORazepam  (ATIVAN ) 1 MG tablet  3 times daily PRN        06/13/24 0623    ondansetron  (ZOFRAN -ODT) 4 MG disintegrating tablet  Every 8 hours PRN         06/13/24 0623               Tomio Kirk, MD 06/13/24 2349

## 2024-06-14 ENCOUNTER — Encounter (HOSPITAL_COMMUNITY): Payer: Self-pay | Admitting: Emergency Medicine

## 2024-06-14 ENCOUNTER — Emergency Department (HOSPITAL_COMMUNITY): Payer: Self-pay

## 2024-06-14 ENCOUNTER — Emergency Department (HOSPITAL_COMMUNITY)
Admission: EM | Admit: 2024-06-14 | Discharge: 2024-06-15 | Disposition: A | Discharge status: Home / Self Care | Payer: Self-pay | Attending: Emergency Medicine | Admitting: Emergency Medicine

## 2024-06-14 ENCOUNTER — Other Ambulatory Visit: Payer: Self-pay

## 2024-06-14 DIAGNOSIS — R42 Dizziness and giddiness: Secondary | ICD-10-CM | POA: Insufficient documentation

## 2024-06-14 DIAGNOSIS — R112 Nausea with vomiting, unspecified: Secondary | ICD-10-CM | POA: Insufficient documentation

## 2024-06-14 LAB — URINALYSIS, ROUTINE W REFLEX MICROSCOPIC
Bacteria, UA: NONE SEEN
Bilirubin Urine: NEGATIVE
Glucose, UA: NEGATIVE mg/dL
Ketones, ur: NEGATIVE mg/dL
Leukocytes,Ua: NEGATIVE
Nitrite: NEGATIVE
Protein, ur: 30 mg/dL — AB
RBC / HPF: >50 RBC/hpf (ref 0–5)
Specific Gravity, Urine: 1.024 (ref 1.005–1.030)
pH: 6 (ref 5.0–8.0)

## 2024-06-14 LAB — COMPREHENSIVE METABOLIC PANEL WITH GFR
ALT: 14 U/L (ref 0–44)
AST: 22 U/L (ref 15–41)
Albumin: 4.8 g/dL (ref 3.5–5.0)
Alkaline Phosphatase: 47 U/L (ref 38–126)
Anion gap: 13 (ref 5–15)
BUN: 13 mg/dL (ref 6–20)
CO2: 24 mmol/L (ref 22–32)
Calcium: 9.3 mg/dL (ref 8.9–10.3)
Chloride: 102 mmol/L (ref 98–111)
Creatinine, Ser: 0.61 mg/dL (ref 0.44–1.00)
GFR, Estimated: >60 mL/min (ref >60)
Glucose, Bld: 86 mg/dL (ref 70–99)
Potassium: 4.1 mmol/L (ref 3.5–5.1)
Sodium: 139 mmol/L (ref 135–145)
Total Bilirubin: 0.3 mg/dL (ref 0.0–1.2)
Total Protein: 7.9 g/dL (ref 6.5–8.1)

## 2024-06-14 LAB — CBC
HCT: 39.1 % (ref 36.0–46.0)
Hemoglobin: 12.6 g/dL (ref 12.0–15.0)
MCH: 27.2 pg (ref 26.0–34.0)
MCHC: 32.2 g/dL (ref 30.0–36.0)
MCV: 84.3 fL (ref 80.0–100.0)
Platelets: 229 K/uL (ref 150–400)
RBC: 4.64 MIL/uL (ref 3.87–5.11)
RDW: 14.6 % (ref 11.5–15.5)
WBC: 8 K/uL (ref 4.0–10.5)
nRBC: 0 % (ref 0.0–0.2)

## 2024-06-14 LAB — LIPASE, BLOOD: Lipase: 32 U/L (ref 11–51)

## 2024-06-14 LAB — D-DIMER, QUANTITATIVE: D-Dimer, Quant: 0.31 ug{FEU}/mL (ref 0.00–0.50)

## 2024-06-14 LAB — URINE CULTURE: Culture: <10000 — AB

## 2024-06-14 LAB — URINE DRUG SCREEN
Amphetamines: NEGATIVE
Barbiturates: NEGATIVE
Benzodiazepines: POSITIVE — AB
Cocaine: NEGATIVE
Fentanyl: NEGATIVE
Methadone Scn, Ur: NEGATIVE
Opiates: NEGATIVE
Tetrahydrocannabinol: NEGATIVE

## 2024-06-14 LAB — PREGNANCY, URINE: Preg Test, Ur: NEGATIVE

## 2024-06-14 MED ORDER — LACTATED RINGERS IV BOLUS
1000.0000 mL | Freq: Once | INTRAVENOUS | Status: AC
  Administered 2024-06-14: 1000 mL via INTRAVENOUS

## 2024-06-14 MED ORDER — ONDANSETRON HCL 4 MG/2ML IJ SOLN
4.0000 mg | Freq: Once | INTRAMUSCULAR | Status: AC
  Administered 2024-06-14: 4 mg via INTRAVENOUS
  Filled 2024-06-14: qty 2

## 2024-06-14 MED ORDER — METOCLOPRAMIDE HCL 5 MG/ML IJ SOLN
10.0000 mg | Freq: Once | INTRAMUSCULAR | Status: AC
  Administered 2024-06-14: 10 mg via INTRAVENOUS
  Filled 2024-06-14: qty 2

## 2024-06-14 MED ORDER — DIPHENHYDRAMINE HCL 50 MG/ML IJ SOLN
25.0000 mg | Freq: Once | INTRAMUSCULAR | Status: AC
  Administered 2024-06-14: 25 mg via INTRAVENOUS
  Filled 2024-06-14: qty 1

## 2024-06-14 NOTE — ED Provider Notes (Signed)
 Melissa Santana EMERGENCY DEPARTMENT AT [facility] Provider Note   CSN: [id number] Arrival date & time: 06/14/24  1910     Patient presents with: Emesis   Melissa Santana is a 23 y.o. female.  She has history of depression, anxiety, dysmenorrhea and reports she has tachycardic spells where her heart rate gets very fast.  This ER today for nausea vomiting and generalized dizziness.  This started several days ago, she was seen in the ER proximately 36 hours ago and had labs and IV fluids, but after going home she has been able to drink fluids but not able to eat and still feeling unwell and feeling very lightheaded.  She denies chest pain or shortness of breath, no lower extremity swelling or pain.  No pleurisy, no syncope.  She states when she was in the ER previously she been having some tingling of her bilateral hands and feet which is now resolved.    Emesis      Prior to Admission medications   Medication Sig Start Date End Date Taking? Authorizing Provider  Drospirenone-Estetrol (NEXTSTELLIS ) 3-14.2 MG TABS Take 1 tablet by mouth daily. 02/23/22   Signa Delon LABOR, NP  LORazepam  (ATIVAN ) 1 MG tablet Take 1 tablet (1 mg total) by mouth 3 (three) times daily as needed for anxiety. 06/13/24   Mesner, Selinda, MD  ondansetron  (ZOFRAN -ODT) 4 MG disintegrating tablet Take 1 tablet (4 mg total) by mouth every 8 (eight) hours as needed for vomiting. 06/13/24   Mesner, Selinda, MD    Allergies: Patient has no known allergies.    Review of Systems  Gastrointestinal:  Positive for vomiting.    Updated Vital Signs BP (!) 150/80 (BP Location: Right Arm)   Pulse 95   Temp 99 F (37.2 C) (Oral)   Resp 20   Ht 5' 7 (1.702 m)   Wt 52.2 kg   LMP 06/13/2024 (Exact Date)   SpO2 100%   BMI 18.01 kg/m   Physical Exam Vitals and nursing note reviewed.  Constitutional:      General: She is not in acute distress.    Appearance: She is well-developed.  HENT:     Head:  Normocephalic and atraumatic.     Nose: Nose normal.     Mouth/Throat:     Mouth: Mucous membranes are moist.  Eyes:     Extraocular Movements: Extraocular movements intact.     Conjunctiva/sclera: Conjunctivae normal.     Pupils: Pupils are equal, round, and reactive to light.  Cardiovascular:     Rate and Rhythm: Normal rate and regular rhythm.     Heart sounds: No murmur heard. Pulmonary:     Effort: Pulmonary effort is normal. No respiratory distress.     Breath sounds: Normal breath sounds.  Abdominal:     Palpations: Abdomen is soft.     Tenderness: There is no abdominal tenderness.  Musculoskeletal:        General: No swelling.     Cervical back: Neck supple.  Skin:    General: Skin is warm and dry.     Capillary Refill: Capillary refill takes less than 2 seconds.  Neurological:     General: No focal deficit present.     Mental Status: She is alert and oriented to person, place, and time.  Psychiatric:        Mood and Affect: Mood normal.     (all labs ordered are listed, but only abnormal results are displayed) Labs Reviewed  URINALYSIS,  ROUTINE W REFLEX MICROSCOPIC - Abnormal; Notable for the following components:      Result Value   Hgb urine dipstick SMALL (*)    Protein, ur 30 (*)    All other components within normal limits  PREGNANCY, URINE  LIPASE, BLOOD  COMPREHENSIVE METABOLIC PANEL WITH GFR  CBC  URINE DRUG SCREEN    EKG: None  Radiology: No results found.   Procedures   Medications Ordered in the ED  lactated ringers bolus 1,000 mL (has no administration in time range)  ondansetron  (ZOFRAN ) injection 4 mg (has no administration in time range)                                    Medical Decision Making Differential diagnosis includes not limited to gastroenteritis, gastritis, CHS, kidney stone, UTI, sepsis, electrolyte derangement, PE, other  ED course: Patient presents to ER for evaluation of lightheadedness.  She was seen about 36  hours ago for the same, felt to be possibly anxiety, given medicine and discharged home but she still feeling poorly still feeling lightheaded when she stands up.  She states she has some mild shortness of breath.  Labs are reassuring, she reports some mild upper abdominal pain, no CVA tenderness or dysuria, she does have hematuria so CT was ordered to rule out possible kidney stone and this was negative.  Patient was given IV fluids and Zofran  and still feels lightheaded, she is not orthostatic but still feels unwell, says she has been intermittently tachycardic and is on OCPs considered PE, and ordered D-dimer.  Will give additional nausea medicines.  Signed out to Dr. Theadore at this time for evaluation.     Amount and/or Complexity of Data Reviewed External Data Reviewed: labs and notes. Labs: ordered.    Details: BC normal, CMP normal, lipase normal, UDS positive for benzodiazepines-had gotten Versed at last ED visit, UA with hematuria but no UTI Radiology: ordered.  Risk Prescription drug management.        Final diagnoses:  None    ED Discharge Orders     None          Melissa Sherran LABOR, PA-C 06/14/24 2317    Melissa Sid SAILOR, MD 06/15/24 [phone]

## 2024-06-14 NOTE — Discharge Instructions (Addendum)
 You were evaluated in the Emergency Department and after careful evaluation, we did not find any emergent condition requiring admission or further testing in the hospital.  Your exam/testing today is overall reassuring.  Blood test for blood clots was negative.  Recommend continuing your home medications to control your symptoms.  May be due to a viral illness.  Would recommend using MyChart to follow-up on your COVID flu and RSV testing.  Follow-up with your primary care doctor.  Continue hydration and rest at home as we discussed.  Please return to the Emergency Department if you experience any worsening of your condition.   Thank you for allowing us  to be a part of your care.

## 2024-06-14 NOTE — ED Notes (Signed)
 Pt stated she is still dizzy. EDP made aware

## 2024-06-14 NOTE — ED Triage Notes (Signed)
 Pt arrived POV from home c/o emesis, dizziness and just not feeling good. Pt states she was seen the other night for the same but states her symptoms have not improved.

## 2024-06-14 NOTE — ED Provider Notes (Signed)
  Provider Note MRN:  [id number]  Arrival date & time: 06/15/24    ED Course and Medical Decision Making  Assumed care of patient at sign-out or upon transfer.  Lightheadedness and tachycardia when standing, persistent vomiting, some shortness of breath.  D-dimer pending.  Symptomatic management and reassess.   12 AM update: Patient nontoxic, vitals normal, D-dimer is negative.  I see no indication for further testing or admission at this time, patient appropriate for discharge with return precautions. Procedures  Final Clinical Impressions(s) / ED Diagnoses     ICD-10-CM   1. Dizziness  R42     2. Nausea and vomiting, unspecified vomiting type  R11.2       ED Discharge Orders     None         Discharge Instructions      You were evaluated in the Emergency Department and after careful evaluation, we did not find any emergent condition requiring admission or further testing in the hospital.  Your exam/testing today is overall reassuring.  Blood test for blood clots was negative.  Recommend continuing your home medications to control your symptoms.  May be due to a viral illness.  Would recommend using MyChart to follow-up on your COVID flu and RSV testing.  Follow-up with your primary care doctor.  Continue hydration and rest at home as we discussed.  Please return to the Emergency Department if you experience any worsening of your condition.   Thank you for allowing us  to be a part of your care.      Ozell HERO. Theadore, MD [facility] Emergency Medicine [facility] [email or web address]    Theadore Ozell HERO, MD 06/15/24 [phone]

## 2024-06-15 LAB — RESP PANEL BY RT-PCR (RSV, FLU A&B, COVID)  RVPGX2
Influenza A by PCR: NEGATIVE
Influenza B by PCR: NEGATIVE
Resp Syncytial Virus by PCR: NEGATIVE
SARS Coronavirus 2 by RT PCR: POSITIVE — AB

## 2024-06-17 ENCOUNTER — Ambulatory Visit: Payer: Self-pay

## 2024-06-17 NOTE — Telephone Encounter (Signed)
 FYI Only or Action Required?: FYI only for provider.  Patient was last seen in primary care on 03/22/2022 by Melissa Santana, Melissa RAMAN, FNP.  Called Nurse Triage reporting Vomiting.  Symptoms began a week ago.  Interventions attempted: Other: seen in the ED.  Symptoms are: gradually improving.  Triage Disposition: See PCP When Office is Open (Within 3 Days)  Patient/caregiver understands and will follow disposition?: Yes     Copied from CRM #[id number]. Topic: Clinical - Red Word Triage >> Jun 17, 2024  2:30 PM Antwanette L wrote: Red Word that prompted transfer to Nurse Triage: The patient is requesting a referral to a GI specialist. She has visited the emergency room twice for the same symptoms-dizziness and nausea-with the most recent visit occurring on 10/24 at [facility]      Reason for Disposition  [1] MILD vomiting with diarrhea AND [2] present > 5 days  Answer Assessment - Initial Assessment Questions 1. VOMITING SEVERITY: How many times have you vomited in the past 24 hours?      0 2. ONSET: When did the vomiting begin?      About a week ago  3. FLUIDS: What fluids or food have you vomited up today? Have you been able to keep any fluids down?     None 4. ABDOMEN PAIN: Are your having any abdomen pain? If Yes : How bad is it and what does it feel like? (e.g., crampy, dull, intermittent, constant)      Intermittent cramping  5. DIARRHEA: Is there any diarrhea? If Yes, ask: How many times today?      Mild diarrhea  6. CONTACTS: Is there anyone else in the family with the same symptoms?      No 7. CAUSE: What do you think is causing your vomiting?     Unsure  8. HYDRATION STATUS: Any signs of dehydration? (e.g., dry mouth [not only dry lips], too weak to stand) When did you last urinate?     Able to keep fluids down now 9. OTHER SYMPTOMS: Do you have any other symptoms? (e.g., fever, headache, vertigo, vomiting blood or coffee grounds, recent  head injury)     Dizziness, nausea  10. PREGNANCY: Is there any chance you are pregnant? When was your last menstrual period?       No  Protocols used: Vomiting-A-AH

## 2024-06-19 ENCOUNTER — Inpatient Hospital Stay: Payer: Self-pay | Admitting: Family Medicine

## 2024-06-21 ENCOUNTER — Other Ambulatory Visit: Payer: Self-pay

## 2024-06-21 ENCOUNTER — Inpatient Hospital Stay: Payer: Self-pay | Admitting: Family Medicine

## 2024-06-21 ENCOUNTER — Encounter (HOSPITAL_COMMUNITY): Payer: Self-pay

## 2024-06-21 ENCOUNTER — Emergency Department (HOSPITAL_COMMUNITY)
Admission: EM | Admit: 2024-06-21 | Discharge: 2024-06-21 | Disposition: A | Discharge status: Home / Self Care | Payer: Self-pay | Attending: Emergency Medicine | Admitting: Emergency Medicine

## 2024-06-21 DIAGNOSIS — R112 Nausea with vomiting, unspecified: Secondary | ICD-10-CM | POA: Insufficient documentation

## 2024-06-21 DIAGNOSIS — U071 COVID-19: Secondary | ICD-10-CM

## 2024-06-21 DIAGNOSIS — R109 Unspecified abdominal pain: Secondary | ICD-10-CM | POA: Insufficient documentation

## 2024-06-21 DIAGNOSIS — Z79899 Other long term (current) drug therapy: Secondary | ICD-10-CM | POA: Insufficient documentation

## 2024-06-21 LAB — CBC WITH DIFFERENTIAL/PLATELET
Abs Immature Granulocytes: 0.03 K/uL (ref 0.00–0.07)
Basophils Absolute: 0 K/uL (ref 0.0–0.1)
Basophils Relative: 0 %
Eosinophils Absolute: 0.1 K/uL (ref 0.0–0.5)
Eosinophils Relative: 1 %
HCT: 37.3 % (ref 36.0–46.0)
Hemoglobin: 12 g/dL (ref 12.0–15.0)
Immature Granulocytes: 0 %
Lymphocytes Relative: 28 %
Lymphs Abs: 2.9 K/uL (ref 0.7–4.0)
MCH: 26.9 pg (ref 26.0–34.0)
MCHC: 32.2 g/dL (ref 30.0–36.0)
MCV: 83.6 fL (ref 80.0–100.0)
Monocytes Absolute: 0.6 K/uL (ref 0.1–1.0)
Monocytes Relative: 6 %
Neutro Abs: 6.6 K/uL (ref 1.7–7.7)
Neutrophils Relative %: 65 %
Platelets: 278 K/uL (ref 150–400)
RBC: 4.46 MIL/uL (ref 3.87–5.11)
RDW: 14.5 % (ref 11.5–15.5)
WBC: 10.2 K/uL (ref 4.0–10.5)
nRBC: 0 % (ref 0.0–0.2)

## 2024-06-21 LAB — LIPASE, BLOOD: Lipase: 27 U/L (ref 11–51)

## 2024-06-21 LAB — COMPREHENSIVE METABOLIC PANEL WITH GFR
ALT: 15 U/L (ref 0–44)
AST: 16 U/L (ref 15–41)
Albumin: 4.9 g/dL (ref 3.5–5.0)
Alkaline Phosphatase: 45 U/L (ref 38–126)
Anion gap: 12 (ref 5–15)
BUN: 14 mg/dL (ref 6–20)
CO2: 27 mmol/L (ref 22–32)
Calcium: 9.9 mg/dL (ref 8.9–10.3)
Chloride: 101 mmol/L (ref 98–111)
Creatinine, Ser: 0.57 mg/dL (ref 0.44–1.00)
GFR, Estimated: >60 mL/min (ref >60)
Glucose, Bld: 96 mg/dL (ref 70–99)
Potassium: 3.6 mmol/L (ref 3.5–5.1)
Sodium: 140 mmol/L (ref 135–145)
Total Bilirubin: 0.3 mg/dL (ref 0.0–1.2)
Total Protein: 8 g/dL (ref 6.5–8.1)

## 2024-06-21 MED ORDER — PROMETHAZINE HCL 25 MG/ML IJ SOLN
INTRAMUSCULAR | Status: AC
  Filled 2024-06-21: qty 1

## 2024-06-21 MED ORDER — KETOROLAC TROMETHAMINE 30 MG/ML IJ SOLN
30.0000 mg | Freq: Once | INTRAMUSCULAR | Status: AC
  Administered 2024-06-21: 30 mg via INTRAVENOUS
  Filled 2024-06-21: qty 1

## 2024-06-21 MED ORDER — SODIUM CHLORIDE 0.9 % IV SOLN
12.5000 mg | Freq: Four times a day (QID) | INTRAVENOUS | Status: DC | PRN
  Administered 2024-06-21: 12.5 mg via INTRAVENOUS
  Filled 2024-06-21: qty 0.5

## 2024-06-21 MED ORDER — SODIUM CHLORIDE 0.9 % IV BOLUS
1000.0000 mL | Freq: Once | INTRAVENOUS | Status: AC
  Administered 2024-06-21: 1000 mL via INTRAVENOUS

## 2024-06-21 MED ORDER — ONDANSETRON HCL 4 MG/2ML IJ SOLN
4.0000 mg | Freq: Once | INTRAMUSCULAR | Status: AC
  Administered 2024-06-21: 4 mg via INTRAVENOUS
  Filled 2024-06-21: qty 2

## 2024-06-21 NOTE — ED Notes (Signed)
 Gave ginger ale for PO Challenge

## 2024-06-21 NOTE — ED Triage Notes (Signed)
 Pov from home. Cc of emesis (2x in 24 hours) that has returned.  Abdominal pain 7/10

## 2024-06-21 NOTE — ED Provider Notes (Signed)
 [place] EMERGENCY DEPARTMENT AT [facility] Provider Note   CSN: [id number] Arrival date & time: 06/21/24  0040     Patient presents with: No chief complaint on file.   Melissa Santana is a 23 y.o. female.   Patient is a 23 year old female with past medical history of depression.  Patient presenting today with complaints of nausea and vomiting.  Symptoms have been occurring intermittently over the past week.  She has been seen here on 2 prior occasions and has undergone CT scan, laboratory studies, but no definitive cause has been found.  After she was discharged, her COVID test did return positive patient denies any bloody stool or vomit.  No fevers or chills.       Prior to Admission medications   Medication Sig Start Date End Date Taking? Authorizing Provider  Drospirenone-Estetrol (NEXTSTELLIS ) 3-14.2 MG TABS Take 1 tablet by mouth daily. 02/23/22   Signa Delon LABOR, NP  LORazepam  (ATIVAN ) 1 MG tablet Take 1 tablet (1 mg total) by mouth 3 (three) times daily as needed for anxiety. 06/13/24   Mesner, Selinda, MD  ondansetron  (ZOFRAN -ODT) 4 MG disintegrating tablet Take 1 tablet (4 mg total) by mouth every 8 (eight) hours as needed for vomiting. 06/13/24   Mesner, Selinda, MD    Allergies: Patient has no known allergies.    Review of Systems  All other systems reviewed and are negative.   Updated Vital Signs BP 129/67   Pulse (!) 105   Temp 99.7 F (37.6 C)   Resp 18   Ht 5' 7 (1.702 m)   Wt 52.2 kg   LMP 06/13/2024 (Exact Date)   SpO2 99%   BMI 18.01 kg/m   Physical Exam Vitals and nursing note reviewed.  Constitutional:      General: She is not in acute distress.    Appearance: She is well-developed. She is not diaphoretic.  HENT:     Head: Normocephalic and atraumatic.  Cardiovascular:     Rate and Rhythm: Normal rate and regular rhythm.     Heart sounds: No murmur heard.    No friction rub. No gallop.  Pulmonary:     Effort: Pulmonary  effort is normal. No respiratory distress.     Breath sounds: Normal breath sounds. No wheezing.  Abdominal:     General: Bowel sounds are normal. There is no distension.     Palpations: Abdomen is soft.     Tenderness: There is no abdominal tenderness.  Musculoskeletal:        General: Normal range of motion.     Cervical back: Normal range of motion and neck supple.  Skin:    General: Skin is warm and dry.  Neurological:     General: No focal deficit present.     Mental Status: She is alert and oriented to person, place, and time.     (all labs ordered are listed, but only abnormal results are displayed) Labs Reviewed  CBC WITH DIFFERENTIAL/PLATELET  COMPREHENSIVE METABOLIC PANEL WITH GFR  LIPASE, BLOOD    EKG: None  Radiology: No results found.   Procedures   Medications Ordered in the ED  sodium chloride  0.9 % bolus 1,000 mL (has no administration in time range)  ondansetron  (ZOFRAN ) injection 4 mg (has no administration in time range)  ketorolac (TORADOL) 30 MG/ML injection 30 mg (has no administration in time range)  Medical Decision Making Amount and/or Complexity of Data Reviewed Labs: ordered.  Risk Prescription drug management.   Patient is a 23 year old female presenting with nausea and vomiting as described in the HPI.  She did test positive for COVID earlier this week and had been seen here on 2 prior occasions with similar complaints.  Her workups have all been unremarkable.  Patient arrives here today with stable vital signs and is afebrile.   Laboratory studies obtained including CBC, CMP, and lipase, all of which are normal.  Patient has received IV fluids along with both Zofran  and Phenergan for nausea and seems to be feeling somewhat better.  At this point, I suspect a viral etiology.  Her abdomen is benign and CT imaging earlier this week was negative.  I see no indications to repeat the CT scan and feel as  though this would be unnecessary radiation.  Patient will be discharged with continued use of Zofran  and follow-up as needed.  Symptoms may be COVID-related.     Final diagnoses:  None    ED Discharge Orders     None          Geroldine Berg, MD 06/21/24 [phone]

## 2024-06-21 NOTE — Discharge Instructions (Signed)
 Continue Zofran  as previously prescribed as needed for nausea.  You can take 8 mg of this every 6 hours as needed.  Clear liquids for the next 12 hours, then slowly advance diet as tolerated.

## 2024-06-22 ENCOUNTER — Other Ambulatory Visit: Payer: Self-pay

## 2024-06-22 ENCOUNTER — Encounter (HOSPITAL_COMMUNITY): Payer: Self-pay | Admitting: Emergency Medicine

## 2024-06-22 ENCOUNTER — Emergency Department (HOSPITAL_COMMUNITY)
Admission: EM | Admit: 2024-06-22 | Discharge: 2024-06-22 | Disposition: A | Discharge status: Home / Self Care | Payer: Self-pay | Attending: Emergency Medicine | Admitting: Emergency Medicine

## 2024-06-22 ENCOUNTER — Emergency Department (HOSPITAL_COMMUNITY): Payer: Self-pay

## 2024-06-22 DIAGNOSIS — R519 Headache, unspecified: Secondary | ICD-10-CM | POA: Insufficient documentation

## 2024-06-22 DIAGNOSIS — R5383 Other fatigue: Secondary | ICD-10-CM | POA: Insufficient documentation

## 2024-06-22 DIAGNOSIS — E860 Dehydration: Secondary | ICD-10-CM

## 2024-06-22 DIAGNOSIS — R5381 Other malaise: Secondary | ICD-10-CM | POA: Insufficient documentation

## 2024-06-22 DIAGNOSIS — R109 Unspecified abdominal pain: Secondary | ICD-10-CM | POA: Insufficient documentation

## 2024-06-22 DIAGNOSIS — R112 Nausea with vomiting, unspecified: Secondary | ICD-10-CM | POA: Insufficient documentation

## 2024-06-22 LAB — CBC WITH DIFFERENTIAL/PLATELET
Abs Immature Granulocytes: 0.01 K/uL (ref 0.00–0.07)
Basophils Absolute: 0.1 K/uL (ref 0.0–0.1)
Basophils Relative: 1 %
Eosinophils Absolute: 0 K/uL (ref 0.0–0.5)
Eosinophils Relative: 0 %
HCT: 38.1 % (ref 36.0–46.0)
Hemoglobin: 12.4 g/dL (ref 12.0–15.0)
Immature Granulocytes: 0 %
Lymphocytes Relative: 30 %
Lymphs Abs: 2.3 K/uL (ref 0.7–4.0)
MCH: 27.4 pg (ref 26.0–34.0)
MCHC: 32.5 g/dL (ref 30.0–36.0)
MCV: 84.1 fL (ref 80.0–100.0)
Monocytes Absolute: 0.6 K/uL (ref 0.1–1.0)
Monocytes Relative: 7 %
Neutro Abs: 4.9 K/uL (ref 1.7–7.7)
Neutrophils Relative %: 62 %
Platelets: 265 K/uL (ref 150–400)
RBC: 4.53 MIL/uL (ref 3.87–5.11)
RDW: 14.6 % (ref 11.5–15.5)
WBC: 7.9 K/uL (ref 4.0–10.5)
nRBC: 0 % (ref 0.0–0.2)

## 2024-06-22 LAB — COMPREHENSIVE METABOLIC PANEL WITH GFR
ALT: 16 U/L (ref 0–44)
AST: 20 U/L (ref 15–41)
Albumin: 5 g/dL (ref 3.5–5.0)
Alkaline Phosphatase: 47 U/L (ref 38–126)
Anion gap: 16 — ABNORMAL HIGH (ref 5–15)
BUN: 16 mg/dL (ref 6–20)
CO2: 23 mmol/L (ref 22–32)
Calcium: 9.7 mg/dL (ref 8.9–10.3)
Chloride: 100 mmol/L (ref 98–111)
Creatinine, Ser: 0.66 mg/dL (ref 0.44–1.00)
GFR, Estimated: >60 mL/min (ref >60)
Glucose, Bld: 74 mg/dL (ref 70–99)
Potassium: 3.7 mmol/L (ref 3.5–5.1)
Sodium: 139 mmol/L (ref 135–145)
Total Bilirubin: 0.5 mg/dL (ref 0.0–1.2)
Total Protein: 8 g/dL (ref 6.5–8.1)

## 2024-06-22 LAB — URINALYSIS, ROUTINE W REFLEX MICROSCOPIC
Bilirubin Urine: NEGATIVE
Glucose, UA: NEGATIVE mg/dL
Hgb urine dipstick: NEGATIVE
Ketones, ur: 5 mg/dL — AB
Leukocytes,Ua: NEGATIVE
Nitrite: NEGATIVE
Protein, ur: NEGATIVE mg/dL
Specific Gravity, Urine: 1.008 (ref 1.005–1.030)
pH: 6 (ref 5.0–8.0)

## 2024-06-22 LAB — MAGNESIUM: Magnesium: 2.1 mg/dL (ref 1.7–2.4)

## 2024-06-22 LAB — TROPONIN T, HIGH SENSITIVITY
Troponin T High Sensitivity: <15 ng/L (ref 0–19)
Troponin T High Sensitivity: <15 ng/L (ref 0–19)

## 2024-06-22 LAB — HCG, SERUM, QUALITATIVE: Preg, Serum: NEGATIVE

## 2024-06-22 MED ORDER — METOCLOPRAMIDE HCL 5 MG/ML IJ SOLN
10.0000 mg | Freq: Once | INTRAMUSCULAR | Status: AC
  Administered 2024-06-22: 10 mg via INTRAVENOUS
  Filled 2024-06-22: qty 2

## 2024-06-22 MED ORDER — GADOBUTROL 1 MMOL/ML IV SOLN
5.0000 mL | Freq: Once | INTRAVENOUS | Status: AC | PRN
  Administered 2024-06-22: 5 mL via INTRAVENOUS

## 2024-06-22 MED ORDER — LACTATED RINGERS IV BOLUS
500.0000 mL | Freq: Once | INTRAVENOUS | Status: AC
  Administered 2024-06-22: 500 mL via INTRAVENOUS

## 2024-06-22 MED ORDER — ONDANSETRON HCL 4 MG/2ML IJ SOLN
4.0000 mg | Freq: Once | INTRAMUSCULAR | Status: AC
  Administered 2024-06-22: 4 mg via INTRAVENOUS
  Filled 2024-06-22: qty 2

## 2024-06-22 MED ORDER — SODIUM CHLORIDE 0.9 % IV BOLUS
1000.0000 mL | Freq: Once | INTRAVENOUS | Status: AC
  Administered 2024-06-22: 1000 mL via INTRAVENOUS

## 2024-06-22 MED ORDER — DICYCLOMINE HCL 20 MG PO TABS: 20.0000 mg | ORAL_TABLET | Freq: Two times a day (BID) | ORAL | 0 refills | Status: DC

## 2024-06-22 MED ORDER — DIPHENHYDRAMINE HCL 50 MG/ML IJ SOLN
25.0000 mg | Freq: Once | INTRAMUSCULAR | Status: AC
  Administered 2024-06-22: 25 mg via INTRAVENOUS
  Filled 2024-06-22: qty 1

## 2024-06-22 MED ORDER — METOCLOPRAMIDE HCL 10 MG PO TABS: 10.0000 mg | ORAL_TABLET | Freq: Four times a day (QID) | ORAL | 0 refills | Status: DC | PRN

## 2024-06-22 NOTE — ED Provider Notes (Signed)
 [place] EMERGENCY DEPARTMENT AT [facility] Provider Note   CSN: [id number] Arrival date & time: 06/22/24  1745     Patient presents with: Abdominal Pain   Melissa Santana is a 23 y.o. female.   Patient is a 23 year old female who presents to the emergency department with a chief complaint of nausea, vomiting, generalized malaise and fatigue, abdominal pain, dizziness, intermittent headaches which have been ongoing for approximate the past week.  She was evaluated in the emergency department last night but notes that she did have recurrence of symptoms today.  Patient was diagnosed with COVID-19 approximately 1 week ago.  Patient denies any associated chest pain, shortness of breath, palpitations.  She has had intermittent headaches with no pain to neck or back.  She denies any associated fever or chills.  She has had no dysuria or hematuria.   Abdominal Pain Associated symptoms: nausea and vomiting        Prior to Admission medications   Medication Sig Start Date End Date Taking? Authorizing Provider  Drospirenone-Estetrol (NEXTSTELLIS ) 3-14.2 MG TABS Take 1 tablet by mouth daily. 02/23/22   Signa Delon LABOR, NP  LORazepam  (ATIVAN ) 1 MG tablet Take 1 tablet (1 mg total) by mouth 3 (three) times daily as needed for anxiety. 06/13/24   Mesner, Selinda, MD  ondansetron  (ZOFRAN -ODT) 4 MG disintegrating tablet Take 1 tablet (4 mg total) by mouth every 8 (eight) hours as needed for vomiting. 06/13/24   Mesner, Selinda, MD    Allergies: Patient has no known allergies.    Review of Systems  Gastrointestinal:  Positive for abdominal pain, nausea and vomiting.  All other systems reviewed and are negative.   Updated Vital Signs BP 124/80   Pulse 98   Temp 98.8 F (37.1 C) (Oral)   Resp 17   Ht 5' 7 (1.702 m)   Wt 52.2 kg   LMP 06/13/2024 (Exact Date)   SpO2 99%   BMI 18.01 kg/m   Physical Exam Vitals and nursing note reviewed.  Constitutional:      General:  She is not in acute distress.    Appearance: Normal appearance. She is not ill-appearing.  HENT:     Head: Normocephalic and atraumatic.     Nose: Nose normal.     Mouth/Throat:     Mouth: Mucous membranes are moist.  Eyes:     Extraocular Movements: Extraocular movements intact.     Conjunctiva/sclera: Conjunctivae normal.     Pupils: Pupils are equal, round, and reactive to light.  Cardiovascular:     Rate and Rhythm: Normal rate and regular rhythm.     Pulses: Normal pulses.     Heart sounds: Normal heart sounds. No murmur heard.    No gallop.  Pulmonary:     Effort: Pulmonary effort is normal. No respiratory distress.     Breath sounds: Normal breath sounds. No stridor. No wheezing, rhonchi or rales.  Abdominal:     General: Abdomen is flat. Bowel sounds are normal. There is no distension.     Palpations: Abdomen is soft.     Tenderness: There is no abdominal tenderness. Negative signs include Murphy's sign and McBurney's sign.     Hernia: No hernia is present.  Musculoskeletal:        General: Normal range of motion.     Cervical back: Normal range of motion and neck supple.  Skin:    General: Skin is warm and dry.  Neurological:     General:  No focal deficit present.     Mental Status: She is alert and oriented to person, place, and time. Mental status is at baseline.  Psychiatric:        Mood and Affect: Mood normal.        Behavior: Behavior normal.        Thought Content: Thought content normal.        Judgment: Judgment normal.     (all labs ordered are listed, but only abnormal results are displayed) Labs Reviewed  COMPREHENSIVE METABOLIC PANEL WITH GFR - Abnormal; Notable for the following components:      Result Value   Anion gap 16 (*)    All other components within normal limits  URINALYSIS, ROUTINE W REFLEX MICROSCOPIC - Abnormal; Notable for the following components:   Color, Urine COLORLESS (*)    Ketones, ur 5 (*)    All other components within  normal limits  CBC WITH DIFFERENTIAL/PLATELET  MAGNESIUM  HCG, SERUM, QUALITATIVE  TROPONIN T, HIGH SENSITIVITY  TROPONIN T, HIGH SENSITIVITY    EKG: None  Radiology: MR MRV HEAD W WO CONTRAST Result Date: 06/22/2024 EXAM: MRV BRAIN 06/22/2024 07:36:07 PM TECHNIQUE: Multiplanar multisequence MRV of the head was performed with and without the administration of 5mL gadobutrol (GADAVIST) 1 MMOL/ML injection 5 mL GADOBUTROL 1 MMOL/ML IV SOLN intravenous contrast. Multiplanar 2D and 3D reformatted images are provided for review. COMPARISON: None available. CLINICAL HISTORY: Headache, increasing frequency or severity FINDINGS: No focal stenosis or thrombus is seen of the major dural venous sinuses. Right transverse sinus dominant, with hypoplastic left transverse sinus. IMPRESSION: 1. Unremarkable MRV of the head. No evidence for dural venous sinus thrombosis. Electronically signed by: Morene Hoard MD 06/22/2024 07:46 PM EDT RP Workstation: [id number]   MR Brain Wo Contrast (neuro protocol) Result Date: 06/22/2024 EXAM: MRI Brain Without Contrast 06/22/2024 07:11:31 PM TECHNIQUE: Multiplanar multisequence MRI of the head/brain was performed without the administration of intravenous contrast. COMPARISON: None available. CLINICAL HISTORY: Dizziness, headache. FINDINGS: BRAIN AND VENTRICLES: No acute infarct. No intracranial hemorrhage. No mass. No midline shift. No hydrocephalus. The sella is unremarkable. Normal flow voids. ORBITS: No acute abnormality. SINUSES AND MASTOIDS: No acute abnormality. BONES AND SOFT TISSUES: Normal marrow signal. No acute soft tissue abnormality. IMPRESSION: 1. Normal brain MRI. No acute intracranial abnormality. Electronically signed by: Morene Hoard MD 06/22/2024 07:24 PM EDT RP Workstation: [id number]     Procedures   Medications Ordered in the ED  sodium chloride  0.9 % bolus 1,000 mL (0 mLs Intravenous Stopped 06/22/24 1940)  metoCLOPramide (REGLAN)  injection 10 mg (10 mg Intravenous Given 06/22/24 1844)  diphenhydrAMINE (BENADRYL) injection 25 mg (25 mg Intravenous Given 06/22/24 1843)  gadobutrol (GADAVIST) 1 MMOL/ML injection 5 mL (5 mLs Intravenous Contrast Given 06/22/24 1936)  ondansetron  (ZOFRAN ) injection 4 mg (4 mg Intravenous Given 06/22/24 2025)  lactated ringers bolus 500 mL (500 mLs Intravenous New Bag/Given 06/22/24 2025)                                    Medical Decision Making Amount and/or Complexity of Data Reviewed Labs: ordered. Radiology: ordered.  Risk Prescription drug management.   This patient presents to the ED for concern of abdominal pain, nausea, vomiting, generalized malaise and fatigue, dizziness differential diagnosis includes acute appendicitis, cholecystitis because of bowel obstruction, diverticulitis, ovarian torsion or cyst, PID, tubo-ovarian abscess, pyelonephritis, kidney stone, pancreatitis, mesenteric ischemia, acute viral  syndrome, CVA, TIA, cerebral venous thrombosis    Additional history obtained:  Additional history obtained from medical records External records from outside source obtained and reviewed including medical records   Lab Tests:  I Ordered, and personally interpreted labs.  The pertinent results include: No leukocytosis, no anemia, normal kidney function liver function, normal electrolytes, normal magnesium, unremarkable urinalysis, negative troponin   Imaging Studies ordered:  I ordered imaging studies including MRI brain, MRV I independently visualized and interpreted imaging which showed no acute intracranial findings, no cerebral venous thrombosis I agree with the radiologist interpretation   Medicines ordered and prescription drug management:  I ordered medication including Reglan, Benadryl, IV fluids, Zofran  for dehydration, nausea and vomiting Reevaluation of the patient after these medicines showed that the patient improved I have reviewed the patients  home medicines and have made adjustments as needed   Problem List / ED Course:  Patient is doing much better at this time and is stable for discharge home.  Discussed with patient I do suspect that symptoms are still secondary to her recent COVID-19 diagnosis.  Will change her antiemetics at home and see if this will help with her symptoms.  Blood work has been unremarkable today.  She does have indication for mild dehydration and has been given IV fluids.  MRI and MRV was obtained given the patient's intermittent headaches and dizziness and her recent COVID-19 diagnosis to ensure that there is no indication for cerebral venous thrombosis.  There was no indication for this.  Abdominal exam is benign with no focal tenderness throughout do not suspect repeat CT scan of the abdomen and pelvis is warranted at this time.  Close follow-up with PCP was discussed as well as strict turn precautions for any new or worsening symptoms.  Patient voiced understanding and had no additional questions.   Social Determinants of Health:  None        Final diagnoses:  None    ED Discharge Orders     None          Daralene Lonni JONETTA DEVONNA 06/22/24 2144    Suzette Pac, MD 06/23/24 1059

## 2024-06-22 NOTE — ED Triage Notes (Addendum)
 Pt to ed pov c/o fatigue, n/v, abdominal pain that has been ongoing for the past week. Pt was seen in this ED for the same. Pt states sx are not getting better.

## 2024-06-22 NOTE — ED Notes (Signed)
 Pt still in MRI; as to why the delay in EKG.

## 2024-06-22 NOTE — ED Notes (Addendum)
 Patient transported to MRI

## 2024-06-22 NOTE — Discharge Instructions (Signed)
 Please follow-up closely with your primary care doctor on an outpatient basis.  Return to emergency department immediately for any new or worsening symptoms.

## 2024-06-23 ENCOUNTER — Emergency Department (HOSPITAL_COMMUNITY)
Admission: EM | Admit: 2024-06-23 | Discharge: 2024-06-23 | Disposition: A | Discharge status: Home / Self Care | Payer: Self-pay | Attending: Emergency Medicine | Admitting: Emergency Medicine

## 2024-06-23 ENCOUNTER — Encounter (HOSPITAL_COMMUNITY): Payer: Self-pay | Admitting: Emergency Medicine

## 2024-06-23 ENCOUNTER — Other Ambulatory Visit: Payer: Self-pay

## 2024-06-23 DIAGNOSIS — R55 Syncope and collapse: Secondary | ICD-10-CM | POA: Insufficient documentation

## 2024-06-23 DIAGNOSIS — F419 Anxiety disorder, unspecified: Secondary | ICD-10-CM | POA: Insufficient documentation

## 2024-06-23 LAB — COMPREHENSIVE METABOLIC PANEL WITH GFR
ALT: 17 U/L (ref 0–44)
AST: 20 U/L (ref 15–41)
Albumin: 5 g/dL (ref 3.5–5.0)
Alkaline Phosphatase: 44 U/L (ref 38–126)
Anion gap: 15 (ref 5–15)
BUN: 8 mg/dL (ref 6–20)
CO2: 23 mmol/L (ref 22–32)
Calcium: 9.5 mg/dL (ref 8.9–10.3)
Chloride: 102 mmol/L (ref 98–111)
Creatinine, Ser: 0.63 mg/dL (ref 0.44–1.00)
GFR, Estimated: >60 mL/min (ref >60)
Glucose, Bld: 90 mg/dL (ref 70–99)
Potassium: 3.4 mmol/L — ABNORMAL LOW (ref 3.5–5.1)
Sodium: 139 mmol/L (ref 135–145)
Total Bilirubin: 0.5 mg/dL (ref 0.0–1.2)
Total Protein: 7.8 g/dL (ref 6.5–8.1)

## 2024-06-23 LAB — CBC
HCT: 38.4 % (ref 36.0–46.0)
Hemoglobin: 12.5 g/dL (ref 12.0–15.0)
MCH: 27.2 pg (ref 26.0–34.0)
MCHC: 32.6 g/dL (ref 30.0–36.0)
MCV: 83.5 fL (ref 80.0–100.0)
Platelets: 246 K/uL (ref 150–400)
RBC: 4.6 MIL/uL (ref 3.87–5.11)
RDW: 14.7 % (ref 11.5–15.5)
WBC: 7.4 K/uL (ref 4.0–10.5)
nRBC: 0 % (ref 0.0–0.2)

## 2024-06-23 LAB — URINALYSIS, ROUTINE W REFLEX MICROSCOPIC
Bilirubin Urine: NEGATIVE
Glucose, UA: NEGATIVE mg/dL
Hgb urine dipstick: NEGATIVE
Ketones, ur: 20 mg/dL — AB
Leukocytes,Ua: NEGATIVE
Nitrite: NEGATIVE
Protein, ur: NEGATIVE mg/dL
Specific Gravity, Urine: 1.009 (ref 1.005–1.030)
pH: 6 (ref 5.0–8.0)

## 2024-06-23 LAB — PROTIME-INR
INR: 1.1 (ref 0.8–1.2)
Prothrombin Time: 14.6 s (ref 11.4–15.2)

## 2024-06-23 LAB — HCG, QUANTITATIVE, PREGNANCY: hCG, Beta Chain, Quant, S: <1 m[IU]/mL (ref <5)

## 2024-06-23 MED ORDER — PANTOPRAZOLE SODIUM 20 MG PO TBEC: 20.0000 mg | DELAYED_RELEASE_TABLET | Freq: Every day | ORAL | 0 refills | Status: AC

## 2024-06-23 MED ORDER — PROMETHAZINE HCL 25 MG RE SUPP
25.0000 mg | Freq: Four times a day (QID) | RECTAL | 0 refills | Status: DC | PRN
Start: 2024-06-23 — End: 2024-07-04

## 2024-06-23 MED ORDER — BUSPIRONE HCL 10 MG PO TABS: 5.0000 mg | ORAL_TABLET | Freq: Three times a day (TID) | ORAL | 1 refills | Status: DC

## 2024-06-23 MED ORDER — SODIUM CHLORIDE 0.9 % IV BOLUS
1000.0000 mL | Freq: Once | INTRAVENOUS | Status: AC
  Administered 2024-06-23: 1000 mL via INTRAVENOUS

## 2024-06-23 NOTE — Discharge Instructions (Signed)
 Rest for the next couple days and make sure you drink plenty of fluids and follow-up with your doctor as planned this week

## 2024-06-23 NOTE — ED Provider Notes (Signed)
 [place] EMERGENCY DEPARTMENT AT [facility] Provider Note   CSN: [id number] Arrival date & time: 06/23/24  2034     Patient presents with: Dizziness, Anxiety, and Loss of Consciousness   Melissa Santana is a 23 y.o. female.  {Add pertinent medical, surgical, social history, OB history to YEP:[id number]} Patient complains of some nausea and had some syncopal episode today.  She was evaluated yesterday in the emergency department   Dizziness Associated symptoms: syncope   Anxiety  Loss of Consciousness Associated symptoms: anxiety and dizziness        Prior to Admission medications   Medication Sig Start Date End Date Taking? Authorizing Provider  dicyclomine (BENTYL) 20 MG tablet Take 1 tablet (20 mg total) by mouth 2 (two) times daily. 06/22/24   Daralene Lonni BIRCH, PA-C  Drospirenone-Estetrol (NEXTSTELLIS ) 3-14.2 MG TABS Take 1 tablet by mouth daily. 02/23/22   Signa Delon LABOR, NP  LORazepam  (ATIVAN ) 1 MG tablet Take 1 tablet (1 mg total) by mouth 3 (three) times daily as needed for anxiety. 06/13/24   Mesner, Jason, MD  metoCLOPramide (REGLAN) 10 MG tablet Take 1 tablet (10 mg total) by mouth every 6 (six) hours as needed. 06/22/24   Daralene Lonni BIRCH, PA-C  ondansetron  (ZOFRAN -ODT) 4 MG disintegrating tablet Take 1 tablet (4 mg total) by mouth every 8 (eight) hours as needed for vomiting. 06/13/24   Mesner, Selinda, MD    Allergies: Patient has no known allergies.    Review of Systems  Cardiovascular:  Positive for syncope.  Neurological:  Positive for dizziness.    Updated Vital Signs BP 127/79   Pulse 89   Temp 98.8 F (37.1 C) (Oral)   Resp 15   Ht 5' 7 (1.702 m)   Wt 52 kg   LMP 06/13/2024 (Exact Date)   SpO2 99%   BMI 17.96 kg/m   Physical Exam  (all labs ordered are listed, but only abnormal results are displayed) Labs Reviewed  COMPREHENSIVE METABOLIC PANEL WITH GFR - Abnormal; Notable for the following components:      Result  Value   Potassium 3.4 (*)    All other components within normal limits  URINALYSIS, ROUTINE W REFLEX MICROSCOPIC - Abnormal; Notable for the following components:   Ketones, ur 20 (*)    All other components within normal limits  CBC  PROTIME-INR  HCG, QUANTITATIVE, PREGNANCY    EKG: EKG Interpretation Date/Time:  Sunday June 23 2024 21:04:42 EST Ventricular Rate:  96 PR Interval:  130 QRS Duration:  90 QT Interval:  358 QTC Calculation: 453 R Axis:   91  Text Interpretation: Sinus rhythm Borderline right axis deviation Borderline T wave abnormalities Confirmed by Suzette Pac [phone]) on 06/23/2024 11:14:08 PM  Radiology: MR MRV HEAD W WO CONTRAST Result Date: 06/22/2024 EXAM: MRV BRAIN 06/22/2024 07:36:07 PM TECHNIQUE: Multiplanar multisequence MRV of the head was performed with and without the administration of 5mL gadobutrol (GADAVIST) 1 MMOL/ML injection 5 mL GADOBUTROL 1 MMOL/ML IV SOLN intravenous contrast. Multiplanar 2D and 3D reformatted images are provided for review. COMPARISON: None available. CLINICAL HISTORY: Headache, increasing frequency or severity FINDINGS: No focal stenosis or thrombus is seen of the major dural venous sinuses. Right transverse sinus dominant, with hypoplastic left transverse sinus. IMPRESSION: 1. Unremarkable MRV of the head. No evidence for dural venous sinus thrombosis. Electronically signed by: Morene Hoard MD 06/22/2024 07:46 PM EDT RP Workstation: [id number]   MR Brain Wo Contrast (neuro protocol) Result Date: 06/22/2024 EXAM: MRI  Brain Without Contrast 06/22/2024 07:11:31 PM TECHNIQUE: Multiplanar multisequence MRI of the head/brain was performed without the administration of intravenous contrast. COMPARISON: None available. CLINICAL HISTORY: Dizziness, headache. FINDINGS: BRAIN AND VENTRICLES: No acute infarct. No intracranial hemorrhage. No mass. No midline shift. No hydrocephalus. The sella is unremarkable. Normal flow voids.  ORBITS: No acute abnormality. SINUSES AND MASTOIDS: No acute abnormality. BONES AND SOFT TISSUES: Normal marrow signal. No acute soft tissue abnormality. IMPRESSION: 1. Normal brain MRI. No acute intracranial abnormality. Electronically signed by: Morene Hoard MD 06/22/2024 07:24 PM EDT RP Workstation: [id number]    {Document cardiac monitor, telemetry assessment procedure when appropriate:32947} Procedures   Medications Ordered in the ED  sodium chloride  0.9 % bolus 1,000 mL (1,000 mLs Intravenous New Bag/Given 06/23/24 2140)      {Click here for ABCD2, HEART and other calculators REFRESH Note before signing:1}                              Medical Decision Making Amount and/or Complexity of Data Reviewed Labs: ordered.  Syncope secondary to dehydration and anxiety.  Patient is given Phenergan, Protonix  and BuSpar  will follow-up with her PCP  {Document critical care time when appropriate  Document review of labs and clinical decision tools ie CHADS2VASC2, etc  Document your independent review of radiology images and any outside records  Document your discussion with family members, caretakers and with consultants  Document social determinants of health affecting pt's care  Document your decision making why or why not admission, treatments were needed:32947:::1}   Final diagnoses:  Syncope and collapse  Anxiety    ED Discharge Orders     None

## 2024-06-23 NOTE — ED Triage Notes (Signed)
 Pt with c/o dizziness and anxiety throughout day. Father states pt was found lying on her floor in her room after a syncopal episode. Pt seen here multiple times for emesis which she still reports is ongoing despite medications.

## 2024-06-25 ENCOUNTER — Ambulatory Visit: Payer: Self-pay | Admitting: Family Medicine

## 2024-06-25 ENCOUNTER — Encounter: Payer: Self-pay | Admitting: Family Medicine

## 2024-06-25 VITALS — BP 123/75 | HR 135 | Ht 67.0 in | Wt 102.0 lb

## 2024-06-25 DIAGNOSIS — R636 Underweight: Secondary | ICD-10-CM

## 2024-06-25 DIAGNOSIS — Z681 Body mass index (BMI) 19 or less, adult: Secondary | ICD-10-CM

## 2024-06-25 DIAGNOSIS — F419 Anxiety disorder, unspecified: Secondary | ICD-10-CM

## 2024-06-25 DIAGNOSIS — F331 Major depressive disorder, recurrent, moderate: Secondary | ICD-10-CM

## 2024-06-25 MED ORDER — DESVENLAFAXINE SUCCINATE ER 50 MG PO TB24: 50.0000 mg | ORAL_TABLET | Freq: Every day | ORAL | 1 refills | Status: DC

## 2024-06-25 NOTE — Progress Notes (Signed)
 Subjective:  Patient ID: Melissa Santana, female    DOB: 06/15/2001  Age: 23 y.o. MRN: [id number]  CC:   Chief Complaint  Patient presents with   Follow-up    ER follow up    HPI:  23 year old female presents for follow up  Has had 5 ER visits since 10/23. Has had ongoing dizziness, nausea, vomiting, decreased PO intake, anxiety, and syncope. Labs, MRI imaging, EKG have all been essentially unremarkable. UA notable for Ketosis. Last CMP with mild hypokalemia.  She presents today with her grandmother. Longstanding anxiety and depression. Has previously seen psychiatry. Has failed many medications. Has had Genesight. She will bring a copy to the office.  Reports anxiety about health. Has nausea and vomiting when she eats. Currently eating and drinking very little. Has had issues with anorexia in the past. BMI currently 15.98. Has just started on Buspar . Using antiemetics as well.    Lives at home. Not currently working or going to school. Not sure what she wants to do in the future. States that she wants to get this under control.  Patient Active Problem List   Diagnosis Date Noted   Depression, major, recurrent, moderate (HCC) 06/25/2024   Severe anxiety 06/25/2024   Underweight (BMI < 18.5) 06/25/2024   Menorrhagia with irregular cycle 02/09/2022   Dysmenorrhea 02/09/2022   Menorrhagia 06/12/2013    Social Hx   Social History   Socioeconomic History   Marital status: Single    Spouse name: Not on file   Number of children: Not on file   Years of education: Not on file   Highest education level: Not on file  Occupational History   Not on file  Tobacco Use   Smoking status: Never   Smokeless tobacco: Never  Vaping Use   Vaping status: Never Used  Substance and Sexual Activity   Alcohol use: No   Drug use: No   Sexual activity: Not Currently    Birth control/protection: None, Abstinence  Other Topics Concern   Not on file  Social History Narrative   Not on  file   Social Drivers of Health   Financial Resource Strain: High Risk (11/05/2020)   Overall Financial Resource Strain (CARDIA)    Difficulty of Paying Living Expenses: Hard  Food Insecurity: Food Insecurity Present (11/05/2020)   Hunger Vital Sign    Worried About Running Out of Food in the Last Year: Sometimes true    Ran Out of Food in the Last Year: Never true  Transportation Needs: Unmet Transportation Needs (11/05/2020)   PRAPARE - Transportation    Lack of Transportation (Medical): Yes    Lack of Transportation (Non-Medical): Yes  Physical Activity: Sufficiently Active (11/05/2020)   Exercise Vital Sign    Days of Exercise per Week: 6 days    Minutes of Exercise per Session: 60 min  Stress: Stress Concern Present (11/05/2020)   [employer] of Occupational Health - Occupational Stress Questionnaire    Feeling of Stress : Very much  Social Connections: Socially Isolated (11/05/2020)   Social Connection and Isolation Panel    Frequency of Communication with Friends and Family: Three times a week    Frequency of Social Gatherings with Friends and Family: Twice a week    Attends Religious Services: Never    Database Administrator or Organizations: No    Attends Banker Meetings: Never    Marital Status: Never married    Review of Systems Per HPI  Objective:  BP 123/75   Pulse (!) 135   Ht 5' 7 (1.702 m)   Wt 102 lb (46.3 kg)   LMP 06/13/2024 (Exact Date)   SpO2 99%   BMI 15.98 kg/m      06/25/2024    2:34 PM 06/23/2024   11:00 PM 06/23/2024    9:30 PM  BP/Weight  Systolic BP 123 125 127  Diastolic BP 75 75 79  Wt. (Lbs) 102    BMI 15.98 kg/m2      Physical Exam Vitals and nursing note reviewed.  Constitutional:      Comments: Underweight.  Cardiovascular:     Rate and Rhythm: Regular rhythm. Tachycardia present.  Pulmonary:     Effort: Pulmonary effort is normal.     Breath sounds: Normal breath sounds. No wheezing or rales.  Abdominal:      General: There is no distension.     Palpations: Abdomen is soft.     Tenderness: There is no abdominal tenderness.  Neurological:     Mental Status: She is alert.  Psychiatric:     Comments: Flat affect. Depressed mood.     Lab Results  Component Value Date   WBC 7.4 06/23/2024   HGB 12.5 06/23/2024   HCT 38.4 06/23/2024   PLT 246 06/23/2024   GLUCOSE 90 06/23/2024   ALT 17 06/23/2024   AST 20 06/23/2024   NA 139 06/23/2024   K 3.4 (L) 06/23/2024   CL 102 06/23/2024   CREATININE 0.63 06/23/2024   BUN 8 06/23/2024   CO2 23 06/23/2024   TSH 1.970 06/13/2024   INR 1.1 06/23/2024     Assessment & Plan:  Depression, major, recurrent, moderate (HCC) Assessment & Plan: Uncontrolled. Genesight testing brought in. Discussed case with Pharmacist Mliss Griffin.  Best option at this point is Pristiq .  Sending in.  Orders: -     Desvenlafaxine  Succinate ER; Take 1 tablet (50 mg total) by mouth daily.  Dispense: 90 tablet; Refill: 1  Severe anxiety Assessment & Plan: Continue Buspar .   Underweight (BMI < 18.5)    Follow-up:  2 weeks  Khyree Carillo Bluford DO [facility]

## 2024-06-25 NOTE — Patient Instructions (Signed)
 Continue the Buspar .  Follow up in 2 weeks.

## 2024-06-25 NOTE — Assessment & Plan Note (Signed)
 -  Continue Buspar

## 2024-06-25 NOTE — Assessment & Plan Note (Signed)
 Uncontrolled. Genesight testing brought in. Discussed case with Pharmacist Mliss Griffin.  Best option at this point is Pristiq .  Sending in.

## 2024-06-26 ENCOUNTER — Telehealth: Payer: Self-pay

## 2024-06-26 ENCOUNTER — Telehealth: Payer: Self-pay | Admitting: *Deleted

## 2024-06-26 NOTE — Telephone Encounter (Signed)
 Cook, Jayce G, DO  [facility] Please inform patient:  After reviewing her GeneSight test, I sent in Pristiq . She can pick it up at the pharmacy. She should use [facility].  Jacqulyn Ahle DO [facility]

## 2024-06-26 NOTE — Telephone Encounter (Signed)
 Copied from CRM [id number]. Topic: General - Other >> Jun 26, 2024 11:47 AM Delon DASEN wrote: Reason for CRM: patient returned call, gave message in chart, no questions

## 2024-06-26 NOTE — Telephone Encounter (Signed)
 Called and left a message for the patient to return the call to inform per the following;  After reviewing her GeneSight test, I sent in Pristiq . She can pick it up at the pharmacy. She should use Good Rx.  Jacqulyn Ahle DO [facility]

## 2024-06-27 ENCOUNTER — Inpatient Hospital Stay (HOSPITAL_COMMUNITY)
Admission: RE | Admit: 2024-06-27 | Discharge: 2024-07-04 | DRG: 885 | Disposition: A | Discharge status: Home / Self Care | Source: Intra-hospital | Attending: Student in an Organized Health Care Education/Training Program | Admitting: Student in an Organized Health Care Education/Training Program

## 2024-06-27 ENCOUNTER — Encounter (HOSPITAL_COMMUNITY): Payer: Self-pay

## 2024-06-27 ENCOUNTER — Emergency Department (HOSPITAL_COMMUNITY): Payer: Self-pay

## 2024-06-27 ENCOUNTER — Emergency Department (HOSPITAL_COMMUNITY)
Admission: EM | Admit: 2024-06-27 | Discharge: 2024-06-27 | Disposition: A | Discharge status: Other Acute Inpatient Hospital | Payer: Self-pay | Attending: Emergency Medicine | Admitting: Emergency Medicine

## 2024-06-27 ENCOUNTER — Ambulatory Visit: Payer: Self-pay

## 2024-06-27 ENCOUNTER — Encounter (HOSPITAL_COMMUNITY): Payer: Self-pay | Admitting: Family

## 2024-06-27 ENCOUNTER — Other Ambulatory Visit: Payer: Self-pay

## 2024-06-27 DIAGNOSIS — F411 Generalized anxiety disorder: Secondary | ICD-10-CM | POA: Diagnosis present

## 2024-06-27 DIAGNOSIS — Z23 Encounter for immunization: Secondary | ICD-10-CM

## 2024-06-27 DIAGNOSIS — F429 Obsessive-compulsive disorder, unspecified: Secondary | ICD-10-CM | POA: Diagnosis present

## 2024-06-27 DIAGNOSIS — Z560 Unemployment, unspecified: Secondary | ICD-10-CM | POA: Diagnosis not present

## 2024-06-27 DIAGNOSIS — S61512D Laceration without foreign body of left wrist, subsequent encounter: Secondary | ICD-10-CM | POA: Diagnosis not present

## 2024-06-27 DIAGNOSIS — F332 Major depressive disorder, recurrent severe without psychotic features: Secondary | ICD-10-CM | POA: Diagnosis present

## 2024-06-27 DIAGNOSIS — S61511A Laceration without foreign body of right wrist, initial encounter: Secondary | ICD-10-CM | POA: Insufficient documentation

## 2024-06-27 DIAGNOSIS — T1491XA Suicide attempt, initial encounter: Secondary | ICD-10-CM | POA: Diagnosis present

## 2024-06-27 DIAGNOSIS — S61511D Laceration without foreign body of right wrist, subsequent encounter: Secondary | ICD-10-CM | POA: Diagnosis not present

## 2024-06-27 DIAGNOSIS — S0990XA Unspecified injury of head, initial encounter: Secondary | ICD-10-CM | POA: Insufficient documentation

## 2024-06-27 DIAGNOSIS — Z681 Body mass index (BMI) 19 or less, adult: Secondary | ICD-10-CM

## 2024-06-27 DIAGNOSIS — S61512A Laceration without foreign body of left wrist, initial encounter: Secondary | ICD-10-CM | POA: Insufficient documentation

## 2024-06-27 DIAGNOSIS — Z79899 Other long term (current) drug therapy: Secondary | ICD-10-CM

## 2024-06-27 DIAGNOSIS — K219 Gastro-esophageal reflux disease without esophagitis: Secondary | ICD-10-CM | POA: Diagnosis present

## 2024-06-27 DIAGNOSIS — X789XXD Intentional self-harm by unspecified sharp object, subsequent encounter: Secondary | ICD-10-CM | POA: Diagnosis present

## 2024-06-27 DIAGNOSIS — W01198A Fall on same level from slipping, tripping and stumbling with subsequent striking against other object, initial encounter: Secondary | ICD-10-CM | POA: Insufficient documentation

## 2024-06-27 DIAGNOSIS — F331 Major depressive disorder, recurrent, moderate: Secondary | ICD-10-CM | POA: Diagnosis present

## 2024-06-27 DIAGNOSIS — S61519A Laceration without foreign body of unspecified wrist, initial encounter: Secondary | ICD-10-CM

## 2024-06-27 LAB — COMPREHENSIVE METABOLIC PANEL WITH GFR
ALT: 18 U/L (ref 0–44)
AST: 21 U/L (ref 15–41)
Albumin: 4.4 g/dL (ref 3.5–5.0)
Alkaline Phosphatase: 37 U/L — ABNORMAL LOW (ref 38–126)
Anion gap: 12 (ref 5–15)
BUN: 12 mg/dL (ref 6–20)
CO2: 26 mmol/L (ref 22–32)
Calcium: 9.2 mg/dL (ref 8.9–10.3)
Chloride: 104 mmol/L (ref 98–111)
Creatinine, Ser: 0.57 mg/dL (ref 0.44–1.00)
GFR, Estimated: >60 mL/min (ref >60)
Glucose, Bld: 93 mg/dL (ref 70–99)
Potassium: 3.5 mmol/L (ref 3.5–5.1)
Sodium: 142 mmol/L (ref 135–145)
Total Bilirubin: 0.4 mg/dL (ref 0.0–1.2)
Total Protein: 6.9 g/dL (ref 6.5–8.1)

## 2024-06-27 LAB — CBC
HCT: 33.9 % — ABNORMAL LOW (ref 36.0–46.0)
Hemoglobin: 11 g/dL — ABNORMAL LOW (ref 12.0–15.0)
MCH: 27.5 pg (ref 26.0–34.0)
MCHC: 32.4 g/dL (ref 30.0–36.0)
MCV: 84.8 fL (ref 80.0–100.0)
Platelets: 213 K/uL (ref 150–400)
RBC: 4 MIL/uL (ref 3.87–5.11)
RDW: 14.5 % (ref 11.5–15.5)
WBC: 8.9 K/uL (ref 4.0–10.5)
nRBC: 0 % (ref 0.0–0.2)

## 2024-06-27 LAB — URINE DRUG SCREEN
Amphetamines: NEGATIVE
Barbiturates: NEGATIVE
Benzodiazepines: NEGATIVE
Cocaine: NEGATIVE
Fentanyl: NEGATIVE
Methadone Scn, Ur: NEGATIVE
Opiates: NEGATIVE
Tetrahydrocannabinol: NEGATIVE

## 2024-06-27 LAB — SALICYLATE LEVEL: Salicylate Lvl: <7 mg/dL — ABNORMAL LOW (ref 7.0–30.0)

## 2024-06-27 LAB — ETHANOL: Alcohol, Ethyl (B): <15 mg/dL (ref <15)

## 2024-06-27 LAB — HCG, SERUM, QUALITATIVE: Preg, Serum: NEGATIVE

## 2024-06-27 LAB — ACETAMINOPHEN LEVEL: Acetaminophen (Tylenol), Serum: <10 ug/mL — ABNORMAL LOW (ref 10–30)

## 2024-06-27 MED ORDER — HALOPERIDOL LACTATE 5 MG/ML IJ SOLN: 10.0000 mg | Freq: Three times a day (TID) | INTRAMUSCULAR | Status: DC | PRN

## 2024-06-27 MED ORDER — DIPHENHYDRAMINE HCL 50 MG/ML IJ SOLN: 50.0000 mg | Freq: Three times a day (TID) | INTRAMUSCULAR | Status: DC | PRN

## 2024-06-27 MED ORDER — PANTOPRAZOLE SODIUM 20 MG PO TBEC
20.0000 mg | DELAYED_RELEASE_TABLET | Freq: Every day | ORAL | Status: DC
  Administered 2024-06-27 – 2024-07-04 (×8): 20 mg via ORAL
  Filled 2024-06-27 (×7): qty 1
  Filled 2024-06-27: qty 10
  Filled 2024-06-27: qty 1

## 2024-06-27 MED ORDER — ESCITALOPRAM OXALATE 10 MG PO TABS
10.0000 mg | ORAL_TABLET | Freq: Every day | ORAL | Status: DC
  Administered 2024-06-27 – 2024-07-02 (×6): 10 mg via ORAL
  Filled 2024-06-27 (×6): qty 1

## 2024-06-27 MED ORDER — TRAZODONE HCL 50 MG PO TABS
50.0000 mg | ORAL_TABLET | Freq: Every evening | ORAL | Status: DC | PRN
  Administered 2024-06-27 – 2024-07-03 (×7): 50 mg via ORAL
  Filled 2024-06-27: qty 1
  Filled 2024-06-27: qty 10
  Filled 2024-06-27 (×6): qty 1

## 2024-06-27 MED ORDER — LORAZEPAM 2 MG/ML IJ SOLN: 2.0000 mg | Freq: Three times a day (TID) | INTRAMUSCULAR | Status: DC | PRN

## 2024-06-27 MED ORDER — ENSURE PLUS HIGH PROTEIN PO LIQD
237.0000 mL | Freq: Two times a day (BID) | ORAL | Status: DC
  Administered 2024-06-27 – 2024-07-03 (×12): 237 mL via ORAL
  Filled 2024-06-27 (×15): qty 237

## 2024-06-27 MED ORDER — LIDOCAINE HCL (PF) 1 % IJ SOLN
INTRAMUSCULAR | Status: AC
Start: 2024-06-27 — End: 2024-06-27
  Administered 2024-06-27: 30 mL
  Filled 2024-06-27: qty 30

## 2024-06-27 MED ORDER — HALOPERIDOL 5 MG PO TABS: 5.0000 mg | ORAL_TABLET | Freq: Three times a day (TID) | ORAL | Status: DC | PRN

## 2024-06-27 MED ORDER — ALUM & MAG HYDROXIDE-SIMETH 200-200-20 MG/5ML PO SUSP: 30.0000 mL | ORAL | Status: DC | PRN

## 2024-06-27 MED ORDER — DIPHENHYDRAMINE HCL 25 MG PO CAPS: 50.0000 mg | ORAL_CAPSULE | Freq: Three times a day (TID) | ORAL | Status: DC | PRN

## 2024-06-27 MED ORDER — HYDROXYZINE HCL 25 MG PO TABS
25.0000 mg | ORAL_TABLET | Freq: Three times a day (TID) | ORAL | Status: DC | PRN
  Administered 2024-06-27 – 2024-07-03 (×8): 25 mg via ORAL
  Filled 2024-06-27 (×3): qty 1
  Filled 2024-06-27: qty 10
  Filled 2024-06-27 (×5): qty 1

## 2024-06-27 MED ORDER — LIDOCAINE HCL (PF) 1 % IJ SOLN: 30.0000 mL | Freq: Once | INTRAMUSCULAR | Status: AC

## 2024-06-27 MED ORDER — MAGNESIUM HYDROXIDE 400 MG/5ML PO SUSP: 30.0000 mL | Freq: Every day | ORAL | Status: DC | PRN

## 2024-06-27 MED ORDER — INFLUENZA VIRUS VACC SPLIT PF (FLUZONE) 0.5 ML IM SUSY
0.5000 mL | PREFILLED_SYRINGE | INTRAMUSCULAR | Status: AC
  Administered 2024-06-28: 0.5 mL via INTRAMUSCULAR
  Filled 2024-06-27: qty 0.5

## 2024-06-27 MED ORDER — ACETAMINOPHEN 325 MG PO TABS: 650.0000 mg | ORAL_TABLET | Freq: Four times a day (QID) | ORAL | Status: DC | PRN

## 2024-06-27 MED ORDER — HALOPERIDOL LACTATE 5 MG/ML IJ SOLN: 5.0000 mg | Freq: Three times a day (TID) | INTRAMUSCULAR | Status: DC | PRN

## 2024-06-27 NOTE — ED Notes (Signed)
 Pt with lacerations to right and left wrists. Bleeding controlled. Hematoma note to middle forehead with small cuts. Bleeding controlled. Pt wounds clean and EDP to repair lacerations. Pt calm and cooperative at this time.

## 2024-06-27 NOTE — ED Provider Notes (Signed)
 Care of the patient assumed at.  Patient's labs CT were pending, are now available, are noncontributory.  Patient medically cleared for behavioral health evaluation.   Garrick Charleston, MD 06/27/24 [phone]

## 2024-06-27 NOTE — Plan of Care (Signed)
   Problem: Education: Goal: Knowledge of [facility] Education information/materials will improve Outcome: Progressing Goal: Verbalization of understanding the information provided will improve Outcome: Progressing

## 2024-06-27 NOTE — Group Note (Signed)
 Date:  06/27/2024 Time:  1:50 PM  Group Topic/Focus:  Emotional Education:   The focus of this group is to discuss what feelings/emotions are, and how they are experienced.    Participation Level:  Did Not Attend  Participation Quality:    Affect:    Cognitive:    Insight:   Engagement in Group:    Modes of Intervention:    Additional Comments:    Melissa Santana [name] 06/27/2024, 1:50 PM

## 2024-06-27 NOTE — ED Notes (Signed)
 Pt ambulatory to safe transport vehicle. Accompanied by this RN.

## 2024-06-27 NOTE — [facility] Group Notes (Signed)
 Patient did not attend OT group.

## 2024-06-27 NOTE — ED Notes (Signed)
 Wounds cleaned with soap and water. Hands and arms also cleaned of dried blood.

## 2024-06-27 NOTE — Group Note (Unsigned)
 Date:  06/28/2024 Time:  4:30 AM  Group Topic/Focus:  Wrap-Up Group:   The focus of this group is to help patients review their daily goal of treatment and discuss progress on daily workbooks.    Participation Level:  Active  Participation Quality:  Appropriate and Sharing  Affect:  Appropriate  Cognitive:  Appropriate  Insight: Appropriate  Engagement in Group:  Engaged  Modes of Intervention:  Activity and Socialization  Additional Comments:  Patient completed wrap up group sheet and shared with the group. Patient shared that she is feel, okay. Patient rated her day a 2 out of 10. Patient stated while here, she would like to work on her mental health overall.   Melissa Santana 06/28/2024, 4:30 AM

## 2024-06-27 NOTE — ED Provider Notes (Signed)
 [facility] Emergency Department Provider Note MRN:  [id number]  Arrival date & time: 06/27/24     Chief Complaint   Suicide Attempt   History of Present Illness   Melissa Santana is a 23 y.o. year-old female with a history of generalized anxiety disorder presenting to the ED with chief complaint of suicide attempt.  Increased anxiety over the past few weeks.  Trouble sleeping, no appetite.  Has been following with primary care doctor.  Tonight patient texted her mom an apology that something bad happened.  Mom went to check on her and found her covered in blood.  Trauma to her forehead, unclear how that happened.  Patient says she fell.  Lacerations to bilateral wrist with intentional self-harm.  Patient explains that she is just wanting to feel normal again, wants to be able to sleep, did this to herself sporadically.  Other information obtained from patient's father, who checked her phone.  Patient was googling how many Xanax to take to kill herself.  Review of Systems  A thorough review of systems was obtained and all systems are negative except as noted in the HPI and PMH.   Patient's Health History    Past Medical History:  Diagnosis Date   Anemia    Depression    GAD (generalized anxiety disorder)    Menstrual extraction 06/12/2013    History reviewed. No pertinent surgical history.  History reviewed. No pertinent family history.  Social History   Socioeconomic History   Marital status: Single    Spouse name: Not on file   Number of children: Not on file   Years of education: Not on file   Highest education level: Not on file  Occupational History   Not on file  Tobacco Use   Smoking status: Never   Smokeless tobacco: Never  Vaping Use   Vaping status: Never Used  Substance and Sexual Activity   Alcohol use: No   Drug use: No   Sexual activity: Not Currently    Birth control/protection: None, Abstinence  Other Topics Concern   Not  on file  Social History Narrative   Not on file   Social Drivers of Health   Financial Resource Strain: High Risk (11/05/2020)   Overall Financial Resource Strain (CARDIA)    Difficulty of Paying Living Expenses: Hard  Food Insecurity: Food Insecurity Present (11/05/2020)   Hunger Vital Sign    Worried About Running Out of Food in the Last Year: Sometimes true    Ran Out of Food in the Last Year: Never true  Transportation Needs: Unmet Transportation Needs (11/05/2020)   PRAPARE - Transportation    Lack of Transportation (Medical): Yes    Lack of Transportation (Non-Medical): Yes  Physical Activity: Sufficiently Active (11/05/2020)   Exercise Vital Sign    Days of Exercise per Week: 6 days    Minutes of Exercise per Session: 60 min  Stress: Stress Concern Present (11/05/2020)   [employer] of Occupational Health - Occupational Stress Questionnaire    Feeling of Stress : Very much  Social Connections: Socially Isolated (11/05/2020)   Social Connection and Isolation Panel    Frequency of Communication with Friends and Family: Three times a week    Frequency of Social Gatherings with Friends and Family: Twice a week    Attends Religious Services: Never    Database Administrator or Organizations: No    Attends Banker Meetings: Never    Marital Status: Never married  Intimate Partner Violence: Not At Risk (11/05/2020)   Humiliation, Afraid, Rape, and Kick questionnaire    Fear of Current or Ex-Partner: No    Emotionally Abused: No    Physically Abused: No    Sexually Abused: No     Physical Exam   Vitals:   06/27/24 0630  BP: 129/84  Pulse: 90  Resp: 16  Temp: 98.3 F (36.8 C)  SpO2: 100%    CONSTITUTIONAL: Ill-appearing, severely withdrawn NEURO/PSYCH:  Alert and oriented x 3, no focal deficits EYES:  eyes equal and reactive ENT/NECK:  no LAD, no JVD CARDIO: Regular rate, well-perfused, normal S1 and S2 PULM:  CTAB no wheezing or rhonchi GI/GU:   non-distended, non-tender MSK/SPINE:  No gross deformities, no edema SKIN:  no rash, bruising and abrasion to the mid forehead, laceration to bilateral anterior wrist   *Additional and/or pertinent findings included in MDM below  Diagnostic and Interventional Summary    EKG Interpretation Date/Time:    Ventricular Rate:    PR Interval:    QRS Duration:    QT Interval:    QTC Calculation:   R Axis:      Text Interpretation:         Labs Reviewed  CBC  COMPREHENSIVE METABOLIC PANEL WITH GFR  ACETAMINOPHEN LEVEL  SALICYLATE LEVEL  ETHANOL  HCG, SERUM, QUALITATIVE  URINE DRUG SCREEN    CT HEAD WO CONTRAST ( )    (Results Pending)    Medications  lidocaine (PF) (XYLOCAINE) 1 % injection 30 mL (30 mLs Other Given 06/27/24 0647)     Procedures  /  Critical Care .Laceration Repair  Date/Time: 06/27/2024 7:28 AM  Performed by: Theadore Ozell HERO, MD Authorized by: Theadore Ozell HERO, MD   Consent:    Consent obtained:  Verbal   Consent given by:  Patient   Risks, benefits, and alternatives were discussed: yes     Risks discussed:  Infection, nerve damage, need for additional repair, poor cosmetic result, pain, poor wound healing, vascular damage, tendon damage and retained foreign body   Alternatives discussed:  No treatment Universal protocol:    Procedure explained and questions answered to patient or proxy's satisfaction: yes     Immediately prior to procedure, a time out was called: yes     Patient identity confirmed:  Verbally with patient Anesthesia:    Anesthesia method:  Local infiltration   Local anesthetic:  Lidocaine 1% w/o epi Laceration details:    Location: Right anterior wrist.   Length (cm):  4   Depth (mm):  2 Pre-procedure details:    Preparation:  Patient was prepped and draped in usual sterile fashion Exploration:    Limited defect created (wound extended): no     Hemostasis achieved with:  Direct pressure   Imaging outcome: foreign body not  noted     Wound exploration: wound explored through full range of motion and entire depth of wound visualized   Treatment:    Area cleansed with:  Soap and water   Amount of cleaning:  Standard Skin repair:    Repair method:  Sutures   Suture size:  4-0 and 5-0   Suture material:  Prolene   Suture technique:  Simple interrupted   Number of sutures:  8 Approximation:    Approximation:  Close Repair type:    Repair type:  Simple Post-procedure details:    Dressing:  Open (no dressing)   Procedure completion:  Tolerated well, no immediate complications .Laceration Repair  Date/Time: 06/27/2024 7:29 AM  Performed by: Theadore Ozell HERO, MD Authorized by: Theadore Ozell HERO, MD   Consent:    Consent obtained:  Verbal   Consent given by:  Patient   Risks, benefits, and alternatives were discussed: yes     Risks discussed:  Infection, need for additional repair, nerve damage, poor wound healing, pain, poor cosmetic result, retained foreign body, tendon damage and vascular damage Universal protocol:    Procedure explained and questions answered to patient or proxy's satisfaction: yes     Immediately prior to procedure, a time out was called: yes     Patient identity confirmed:  Verbally with patient Anesthesia:    Anesthesia method:  Local infiltration   Local anesthetic:  Lidocaine 1% w/o epi Laceration details:    Location: Left wrist.   Length (cm):  3   Depth (mm):  2 Pre-procedure details:    Preparation:  Patient was prepped and draped in usual sterile fashion Exploration:    Limited defect created (wound extended): no     Hemostasis achieved with:  Direct pressure   Imaging outcome: foreign body not noted     Wound exploration: wound explored through full range of motion and entire depth of wound visualized     Contaminated: no   Treatment:    Area cleansed with:  Soap and water   Amount of cleaning:  Standard Skin repair:    Repair method:  Sutures   Suture size:  5-0    Suture material:  Prolene   Suture technique:  Simple interrupted   Number of sutures:  3 Approximation:    Approximation:  Close Repair type:    Repair type:  Simple Post-procedure details:    Dressing:  Open (no dressing)   Procedure completion:  Tolerated well, no immediate complications   ED Course and Medical Decision Making  Initial Impression and Ddx Concern for major depressive disorder with suicide attempt tonight.  Patient seems significantly withdrawn, psychomotor depression, not sleeping, not eating.  Suspect need for inpatient psychiatric management.  Medically has obvious signs of head trauma, will obtain CT head to exclude skull fracture, intracranial bleeding.  Given her [employer] history will need medical evaluation with labs to screen for overdose.  Mom explains that she only has access to Motrin  in the house, would not be able to obtain any other medications.  Not showing any signs of toxidrome at this time.  Past medical/surgical history that increases complexity of ED encounter: Generalized anxiety disorder  Interpretation of Diagnostics Labs, imaging pending  Patient Reassessment and Ultimate Disposition/Management     Signed out to oncoming provider at shift change.  Patient management required discussion with the following services or consulting groups:  None  Complexity of Problems Addressed Acute illness or injury that poses threat of life of bodily function  Additional Data Reviewed and Analyzed Further history obtained from: Further history from spouse/family member  Additional Factors Impacting ED Encounter Risk Consideration of hospitalization  Ozell HERO. Theadore, MD [facility] Emergency Medicine [facility] [email or web address]  Final Clinical Impressions(s) / ED Diagnoses     ICD-10-CM   1. Suicide attempt (HCC)  T14.91XA     2. Laceration of wrist, unspecified laterality, initial encounter  S61.519A     3. Traumatic injury  of head, initial encounter  S09.90XA       ED Discharge Orders     None        Discharge Instructions Discussed with and Provided  to Patient:   Discharge Instructions   None      Theadore Ozell HERO, MD 06/27/24 [phone]

## 2024-06-27 NOTE — Plan of Care (Signed)
   Problem: Education: Goal: Knowledge of [facility] Education information/materials will improve Outcome: Progressing Goal: Emotional status will improve Outcome: Progressing Goal: Verbalization of understanding the information provided will improve Outcome: Progressing

## 2024-06-27 NOTE — Progress Notes (Signed)
 [facility] Admission Note:  Patient is a 23 year old female voluntarily admitted to [facility] from [facility]. Parents brought patient in after she had cut both wrists and forehead. Patient required 3 sutures on left wrist. Patient complains of nausea, and stomach upset for 2 weeks, making it difficult to keep food down. Patient is A&Ox4 and cooperative with anxious and depressed mood and congruent affect. Patient denies current SI/HI and AVH. Skin was assessed with John M[facility] and noted were lacerations to forehead, bilateral wrists and right thigh. Patient did not bring any belongings on admission. Orientation packet was reviewed and fall risk was assessed as high due to recent syncope. Patient verbalized understanding of fall prevention education. Patient was oriented to the unit and safety checks were initiated at 15 minute intervals.

## 2024-06-27 NOTE — Progress Notes (Signed)
 Pt has been accepted to [facility] on 06/27/2024  Bed assignment: 306-01  Pt meets inpatient criteria per: Ellouise Dawn NP  Attending Physician will be: Dr.Olson NP  Report can be called un:lwpu: Adult unit: [phone]  Pt can arrive after: [facility] WILL UPDATE   Care Team Notified: [facility] Bretta Qua RN, Ellouise Arch NP  [place] Donnalyn Juran LCSW-A   06/27/2024 10:34 AM

## 2024-06-27 NOTE — Telephone Encounter (Signed)
 FYI Only or Action Required?: Action required by provider: update on patient condition. ADMITTED [facility]  Patient was last seen in primary care on 06/25/2024 by Cook, Jayce G, DO.  Called Nurse Triage reporting self harm.  Symptoms began today.  Interventions attempted: Rest, hydration, or home remedies.  Symptoms are: gradually worsening.  Triage Disposition: Call PCP When Office is Open  Patient/caregiver understands and will follow disposition?: Yes  Copied from CRM #[id number]. Topic: Clinical - Medical Advice >> Jun 27, 2024 11:29 AM Terri MATSU wrote: Reason for CRM: Patient grandma Jackolyn is calling regarding patient . Stated she is at the hospital because she cut her wrist and they wanted to let Dr.Cook know what has happened since their appointment on Tuesday. Callback number for Jackolyn is [phone] and moms callback number is [name] [phone] Reason for Disposition  [1] Caller requesting NON-URGENT health information AND [2] PCP's office is the best resource  Answer Assessment - Initial Assessment Questions 1. REASON FOR CALL: What is the main reason for your call? or How can I best help you?     Grandmother calling in to alert provider that patient is in the hospital for intentional self harm. They completed the swab for [facility]- for medication. Pristiq  was sent in- Pt told mom that she wasn't going to take it.  Has had multiple therapists that haven't worked out. She is being admitted to [facility].  This morning sent her mom a message (email) saying she did something bad.  Mom found her in her locked room covered in blood and required stitches.  Protocols used: Information Only Call - No Triage-A-AH

## 2024-06-27 NOTE — ED Triage Notes (Signed)
 Pov from home with family. Cc of suicide attempt. Cut each wrist horizontal. And then fell and hit her head on a salt lamp in her room. Denies LOC

## 2024-06-27 NOTE — Progress Notes (Signed)
(  Sleep Hours) -6  (Any PRNs that were needed, meds refused, or side effects to meds)- hydroxyzine  25mg , Trazodone 50mg   (Any disturbances and when (visitation, over night)-none  (Concerns raised by the patient)- none  (SI/HI/AVH)-denies

## 2024-06-27 NOTE — Tx Team (Signed)
 Initial Treatment Plan 06/27/2024 2:06 PM DI JASMER [id number]    PATIENT STRESSORS: Health problems     PATIENT STRENGTHS: Communication skills  Supportive family/friends    PATIENT IDENTIFIED PROBLEMS: I can't keep food down. I feel anxious and depressed.                      DISCHARGE CRITERIA:  Improved stabilization in mood, thinking, and/or behavior  PRELIMINARY DISCHARGE PLAN: Outpatient therapy  PATIENT/FAMILY INVOLVEMENT: This treatment plan has been presented to and reviewed with the patient, Melissa Santana.  The patient and family have been given the opportunity to ask questions and make suggestions.  Annalee Larch, RN 06/27/2024, 2:06 PM

## 2024-06-27 NOTE — Consult Note (Signed)
 [facility] Psychiatric Consult Initial  Patient Name: .Melissa Santana  MRN: [id number]  DOB: 2001-05-15  Consult Order details:  Orders (From admission, onward)     Start     Ordered   06/27/24 0727  CONSULT TO CALL ACT TEAM       Ordering Provider: Theadore Ozell HERO, MD  Provider:  (Not yet assigned)  Question:  Reason for Consult?  Answer:  suicide attempt   06/27/24 0726               Mode of Visit: Tele-visit Virtual Statement:TELE PSYCHIATRY ATTESTATION & CONSENT As the provider for this telehealth consult, I attest that I verified the patient's identity using two separate identifiers, introduced myself to the patient, provided my credentials, disclosed my location, and performed this encounter via a HIPAA-compliant, real-time, face-to-face, two-way, interactive audio and video platform and with the full consent and agreement of the patient (or guardian as applicable.) Patient physical location: [facility] ED. Telehealth provider physical location: [facility] ED, [place] Sant[place].   Video start time: 0930 Video end time: 0951am    Psychiatry Consult Evaluation  Service Date: June 27, 2024 LOS:  LOS: 0 days  Chief Complaint Suicide attempt  Primary Psychiatric Diagnoses  Suicide attempt 2.  MDD, recurrent severe 3.  GAD  Assessment  Melissa Santana is a 23 y.o. female admitted: Presented to the EDfor 06/27/2024  6:20 AM for suicide attempt. She carries the psychiatric diagnoses of MDD, recurrent severe GAD and has a past medical history of menorrhagia, dysmenorrhea.   Her current presentation of major depressive disorder, recurrent, severe is most consistent with MDD recurrent severe. She meets criteria for inpatient psychiatric tx based on suicide attempt.  No current outpatient psychotropic medications. Historically she has had a therapeutic response to lexapro .  On initial examination, patient reports depressed and anxious mood. Please see plan below for detailed  recommendations.   Diagnoses:  Active Hospital problems: Principal Problem:   Suicide attempt [facility]) Active Problems:   Depression, major, recurrent, moderate (HCC)    Plan   ## Psychiatric Medication Recommendations:  Start: Escitalopram  10 mg daily/mood  -Acetaminophen 650 mg every 6 hours, as needed/mild pain -Maalox 30 mL oral every 4 hours, as needed/digestion -Hydroxyzine  25 mg 3 times daily as needed/anxiety -Magnesium hydroxide 30 mL daily as needed/mild constipation -Trazodone 50 mg nightly, as needed/sleep    ## Medical Decision Making Capacity: Not specifically addressed in this encounter  ## Further Work-up:  UDS negative -- most recent EKG on 06/27/2024 had QtC of   ## Disposition:-- We recommend inpatient psychiatric hospitalization when medically cleared. Patient is under voluntary admission status at this time; please IVC if attempts to leave hospital.  ## Behavioral / Environmental: -Utilize compassion and acknowledge the patient's experiences while setting clear and realistic expectations for care.    ## Safety and Observation Level:  - Based on my clinical evaluation, I estimate the patient to be at moderate risk of self harm in the current setting. - At this time, we recommend  routine. This decision is based on my review of the chart including patient's history and current presentation, interview of the patient, mental status examination, and consideration of suicide risk including evaluating suicidal ideation, plan, intent, suicidal or self-harm behaviors, risk factors, and protective factors. This judgment is based on our ability to directly address suicide risk, implement suicide prevention strategies, and develop a safety plan while the patient is in the clinical setting. Please contact  our team if there is a concern that risk level has changed.  CSSR Risk Category:C-SSRS RISK CATEGORY: High Risk  Suicide Risk Assessment: Patient has following  modifiable risk factors for suicide: untreated depression, current symptoms: anxiety/panic, insomnia, impulsivity, anhedonia, hopelessness, and pain, medical illness (ie new dx of cancer), which we are addressing by inpatient psychiatric admission. Patient has following non-modifiable or demographic risk factors for suicide: history of suicide attempt and history of self harm behavior Patient has the following protective factors against suicide: Access to outpatient mental health care, Supportive family, and Frustration tolerance  Thank you for this consult request. Recommendations have been communicated to the primary team.  We will continue to recommend inpatient at this time.   Melissa LITTIE Dawn, FNP       History of Present Illness  Relevant Aspects of Hospital ED Course:  Admitted on 06/27/2024 for suicidal ideations.  Patient Report:  Melissa Santana is a 23 year old female who presented to the emergency department voluntarily after a suicide attempt including self-inflicted lacerations to both wrists. Patient is assessed by this nurse practitioner virtually, via telepsychiatry monitor.  Patient is alert and oriented, pleasant and cooperative during assessment.  Patient prefers that her parents, Melissa and Melissa Santana remain present during assessment.  Patient presents with depressed mood, congruent affect.  Patient reports I want to get mental health help.  I want to get physical health help to have been having a hard time eating.  Patient endorses depressive symptoms including decreased appetite, decreased sleep, anhedonia, depressed mood and suicidal ideation.  Initial suicide attempt today.  Patient endorses history of nonsuicidal self-harm behavior by cutting.  Most recent episode of cutting more than 2 years ago.  Patient denies homicidal ideation.  She denies auditory and visual hallucination.  There is no evidence of delusional thought content no indication that patient is responding  to internal stimuli.  Melissa Santana is not followed by outpatient psychiatry currently.  [facility] .  Approximately 2 years ago patient compliant with Lexapro , reports medication effective.  [facility] testing completed.  Antidepressant trials included Wellbutrin , Zoloft  and Prozac  that were not effective patient is not linked with individual counseling currently.  Patient denies history of inpatient psychiatric hospitalization.  Family mental health history includes patient's maternal grandmother diagnosed with generalized anxiety disorder and panic disorder.  Patient offered support and encouragement.  Reviewed treatment plan to include inpatient psychiatric hospitalization.  Discussed medications including escitalopram , trazodone, hydroxyzine .  Reviewed potential side effects and offered patient opportunity to ask questions. Patient remains voluntary, agrees with plan for inpatient psychiatric treatment.  Psych ROS:  Depression: positive Anxiety:  positive  Mania (lifetime and current): denies Psychosis: (lifetime and current): none reported  Collateral information:  Parents Melissa and Melissa Santana, at bedside, patient prefers that parents remain present during assessment.   Review of Systems  Constitutional: Negative.   HENT: Negative.    Eyes: Negative.   Respiratory: Negative.    Cardiovascular: Negative.   Gastrointestinal: Negative.   Genitourinary: Negative.   Musculoskeletal: Negative.   Skin: Negative.        Lacerations to bilateral wrists  Neurological: Negative.   Psychiatric/Behavioral:  Positive for depression and suicidal ideas. The patient is nervous/anxious and has insomnia.      Psychiatric and Social History  Psychiatric History:  Information collected from patient, medical record, patient's parents  Home Meds (current): None currently, Previous Med Trials: Lexapro  previously therapeutic.   Trials included Wellbutrin ,  Zoloft  and Prozac . Therapy: None currently  Prior Psych Hospitalization: Denies Prior Violence: Denies  Family Hx suicide: None reported  Social History:  Occupational Hx: Unemployed Legal Hx: none reported Living Situation: resides with family  Access to weapons/lethal means: Denies   Substance History Alcohol: Denies Illicit drugs: Denies Prescription drug abuse: Denies Rehab hx: none reported  Exam Findings  Physical Exam:  Vital Signs:  Temp:  [98.3 F (36.8 C)] 98.3 F (36.8 C) (11/06 0630) Pulse Rate:  [90] 90 (11/06 0630) Resp:  [16] 16 (11/06 0630) BP: (129)/(84) 129/84 (11/06 0630) SpO2:  [100 %] 100 % (11/06 0630) Weight:  [46.3 kg] 46.3 kg (11/06 0629) Blood pressure 129/84, pulse 90, temperature 98.3 F (36.8 C), resp. rate 16, height 5' 7 (1.702 m), weight 46.3 kg, last menstrual period 06/13/2024, SpO2 100%. Body mass index is 15.98 kg/m.  Physical Exam Vitals and nursing note reviewed.  Constitutional:      Appearance: Normal appearance.  HENT:     Head: Normocephalic and atraumatic.     Nose: Nose normal.  Cardiovascular:     Rate and Rhythm: Normal rate.  Pulmonary:     Effort: Pulmonary effort is normal.  Musculoskeletal:        General: Normal range of motion.     Cervical back: Normal range of motion.  Skin:    General: Skin is warm and dry.     Comments: Lacerations to bilateral wrists, closed with sutures  Neurological:     Mental Status: She is alert and oriented to person, place, and time.  Psychiatric:        Attention and Perception: Attention and perception normal.        Mood and Affect: Affect normal. Mood is depressed.        Speech: Speech normal.        Behavior: Behavior normal. Behavior is cooperative.        Thought Content: Thought content includes suicidal ideation. Thought content includes suicidal plan.        Cognition and Memory: Cognition and memory normal.     Mental Status  Exam: General Appearance: Casual and Fairly Groomed  Orientation:  Full (Time, Place, and Person)  Memory:  Immediate;   Good Recent;   Good Remote;   Good  Concentration:  Concentration: Good and Attention Span: Good  Recall:  Good  Attention  Good  Eye Contact:  Good  Speech:  Clear and Coherent and Normal Rate  Language:  Good  Volume:  Normal  Mood: Depressed  Affect:  Depressed  Thought Process:  Coherent, Goal Directed, and Linear  Thought Content:  WDL and Logical  Suicidal Thoughts:  Yes.  with intent/plan  Homicidal Thoughts:  No  Judgement:  Intact  Insight:  Shallow  Psychomotor Activity:  Normal  Akathisia:  No  Fund of Knowledge:  Good      Assets:  Communication Skills Desire for Improvement Financial Resources/Insurance Housing Intimacy Leisure Time Resilience Social Support  Cognition:  WNL  ADL's:  Intact  AIMS (if indicated):        Other History   These have been pulled in through the EMR, reviewed, and updated if appropriate.  Family History:  The patient's family history is not on file.  Medical History: Past Medical History:  Diagnosis Date   Anemia    Depression    GAD (generalized anxiety disorder)    Menstrual extraction 06/12/2013    Surgical History: History reviewed. No pertinent surgical  history.   Medications:  No current facility-administered medications for this encounter.  Current Outpatient Medications:    ondansetron  (ZOFRAN -ODT) 4 MG disintegrating tablet, Take 1 tablet (4 mg total) by mouth every 8 (eight) hours as needed for vomiting., Disp: 30 tablet, Rfl: 0   pantoprazole  (PROTONIX ) 20 MG tablet, Take 1 tablet (20 mg total) by mouth daily., Disp: 30 tablet, Rfl: 0   busPIRone  (BUSPAR ) 10 MG tablet, Take 0.5 tablets (5 mg total) by mouth 3 (three) times daily. (Patient not taking: Reported on 06/27/2024), Disp: 30 tablet, Rfl: 1   desvenlafaxine  (PRISTIQ ) 50 MG 24 hr tablet, Take 1 tablet (50 mg total) by mouth  daily., Disp: 90 tablet, Rfl: 1   dicyclomine (BENTYL) 20 MG tablet, Take 1 tablet (20 mg total) by mouth 2 (two) times daily., Disp: 20 tablet, Rfl: 0   promethazine (PHENERGAN) 25 MG suppository, Place 1 suppository (25 mg total) rectally every 6 (six) hours as needed for nausea or vomiting. (Patient not taking: Reported on 06/27/2024), Disp: 12 each, Rfl: 0  Allergies: No Known Allergies  Melissa LITTIE Dawn, FNP

## 2024-06-27 NOTE — ED Notes (Signed)
 TTS talking with patient at this time via video.

## 2024-06-28 ENCOUNTER — Encounter (HOSPITAL_COMMUNITY): Payer: Self-pay

## 2024-06-28 DIAGNOSIS — F411 Generalized anxiety disorder: Secondary | ICD-10-CM

## 2024-06-28 DIAGNOSIS — F429 Obsessive-compulsive disorder, unspecified: Secondary | ICD-10-CM | POA: Diagnosis not present

## 2024-06-28 DIAGNOSIS — F332 Major depressive disorder, recurrent severe without psychotic features: Secondary | ICD-10-CM | POA: Diagnosis not present

## 2024-06-28 DIAGNOSIS — T1491XA Suicide attempt, initial encounter: Secondary | ICD-10-CM

## 2024-06-28 LAB — LIPID PANEL
Cholesterol: 138 mg/dL (ref 0–200)
HDL: 57 mg/dL (ref >40)
LDL Cholesterol: 70 mg/dL (ref 0–99)
Total CHOL/HDL Ratio: 2.4 ratio
Triglycerides: 57 mg/dL (ref <150)
VLDL: 11 mg/dL (ref 0–40)

## 2024-06-28 LAB — TSH: TSH: 1.13 u[IU]/mL (ref 0.350–4.500)

## 2024-06-28 LAB — HEMOGLOBIN A1C
Hgb A1c MFr Bld: 4.9 % (ref 4.8–5.6)
Mean Plasma Glucose: 93.93 mg/dL

## 2024-06-28 MED ORDER — OLANZAPINE 2.5 MG PO TABS
2.5000 mg | ORAL_TABLET | Freq: Every day | ORAL | Status: DC
  Administered 2024-06-28: 2.5 mg via ORAL
  Filled 2024-06-28: qty 1

## 2024-06-28 NOTE — H&P (Addendum)
 Psychiatric Admission Assessment Adult  Patient Identification: Melissa Santana MRN:  [id number] Date of Evaluation:  06/28/2024 Chief Complaint:  MDD (major depressive disorder), recurrent severe, without psychosis (HCC) [F33.2] Principal Diagnosis: MDD (major depressive disorder), recurrent severe, without psychosis (HCC) Diagnosis:  Principal Problem:   MDD (major depressive disorder), recurrent severe, without psychosis (HCC) Active Problems:   Generalized anxiety disorder   Suicide attempt (HCC)   OCD (obsessive compulsive disorder)  History of Present Illness:  Melissa Santana is a 23 year old female who presented to our service for suicide attempt by bilateral wrist lacerations in the context of uncontrolled major depression, severe anxiety about her health and obsessive/compulsive thoughts related to her health.  Mood has been deteriorating over the last several months characterized by anhedonia, severe anxiety and OCD related to GI distress.  She has lost 20 pounds over the last several weeks.  This culminated with a suicide attempt.  She has previous trials of Lexapro  with reported good benefit though it has been 1 to 3 years since last medication trials.  She has no history of mania or psychosis, and there is no family history of bipolar disorder.    The patient reports several years of depression and anxiety though has been treated in the past with Lexapro  with good benefit.  She became medication nonadherent 1 to 2 years ago and since then has had return of depression.  She feels that the cause of her depression and anxiety are uncontrolled abdominal pain, nausea and vomiting.  She has been to the hospital and was released without an inpatient hospitalization for this GI distress.  She reports some benefit from proton pump inhibitors and reports having significant amount of acid leak into her throat when she lays flat.  Her GI distress has resulted in a significant amount of anxiety  and has caused her to become obsessing about her health.  Over the last several weeks the patient has lost over 20 pounds due to not eating and has worsened her anxiety.  She reports symptoms of feeling keyed up/on edge, always in the fighter flight, feeling jittery, poor concentration and not sleeping despite feeling tired.  Depression has followed characterized by anhedonia, hopelessness and worthlessness.  Suicidal thoughts developed and she impulsively cut her wrists bilaterally on the day of her admission.  She reports this was for the purpose of taking her life out of feelings of hopelessness related to her GI problems.  She also recognizes that she is obsessing about her health and compulsively doing behaviors related to researching and focusing on her GI distress.  There is no history of psychosis or mania.  She denies AVH.  No history of hypomania either.  She recognizes that she has a very low BMI and does not refuse food but is afraid due to the GI distress.  I discussed treating the patient for MDD, GAD, OCD.  We will restart Lexapro  and supplement with olanzapine.  We discussed risk, benefits and side effects to include increased cholesterol, appetite, sedation, blood sugar and EKG changes.  The patient  agrees to the plan.  I will also start proton pump inhibitor.    Past psychiatric history -For suicide attempt -No prior psychiatric hospitalizations -Previous psychotherapy -Previous trial of Lexapro  with good benefit.  Reports no benefit from Wellbutrin  or Zoloft  in the past.  Social history -High school grad with some college, lives with her mother and father.  Had numerous jobs.  No current romantic relationship and no children.  Psychiatric  history -Unremarkable  Substance history -UDS positive for benzodiazepines.  She was prescribed 5 tablets of divan though only takes them as needed. -No other substances  Family psychiatric history -Anxiety  Medical history -GI  distress -No history of stroke, seizure, TBI   Associated Signs/Symptoms: Depression Symptoms:  depressed mood, anhedonia, insomnia, psychomotor agitation, fatigue, feelings of worthlessness/guilt, difficulty concentrating, hopelessness, recurrent thoughts of death, suicidal attempt, anxiety, loss of energy/fatigue, disturbed sleep, weight loss, decreased appetite, (Hypo) Manic Symptoms:  None Anxiety Symptoms:  Excessive Worry, Obsessive Compulsive Symptoms:   GI related,  Psychotic Symptoms:  None PTSD Symptoms: Negative Total Time spent with patient: 1 hour    Is the patient at risk to self? Yes.    Has the patient been a risk to self in the past 6 months? Yes.    Has the patient been a risk to self within the distant past? No.  Is the patient a risk to others? No.  Has the patient been a risk to others in the past 6 months? No.  Has the patient been a risk to others within the distant past? No.   [place]ia Scale:  Flowsheet Row [facility] (Current) [facility] Most recent reading at 06/27/2024  1:00 PM ED from 06/27/2024 in [facility] Emergency Department at [facility] Most recent reading at 06/27/2024  6:29 AM ED from 06/23/2024 in [facility] Emergency Department at [facility] Most recent reading at 06/23/2024  8:51 PM  C-SSRS RISK CATEGORY High Risk High Risk No Risk     Prior Inpatient Therapy: No.  Prior Outpatient Therapy: Yes.     Alcohol Screening: 1. How often do you have a drink containing alcohol?: Never 2. How many drinks containing alcohol do you have on a typical day when you are drinking?: 1 or 2 3. How often do you have six or more drinks on one occasion?: Never AUDIT-C Score: 0 4. How often during the last year have you found that you were not able to stop drinking once you had started?: Never 5. How often during the last year have you failed to do what was normally expected from  you because of drinking?: Never 6. How often during the last year have you needed a first drink in the morning to get yourself going after a heavy drinking session?: Never 7. How often during the last year have you had a feeling of guilt of remorse after drinking?: Never 8. How often during the last year have you been unable to remember what happened the night before because you had been drinking?: Never 9. Have you or someone else been injured as a result of your drinking?: No 10. Has a relative or friend or a doctor or another health worker been concerned about your drinking or suggested you cut down?: No Alcohol Use Disorder Identification Test Final Score (AUDIT): 0 Substance Abuse History in the last 12 months:  No. Consequences of Substance Abuse: Negative Previous Psychotropic Medications: Yes  Psychological Evaluations: No  Past Medical History:  Past Medical History:  Diagnosis Date   Anemia    Depression    GAD (generalized anxiety disorder)    Menstrual extraction 06/12/2013   History reviewed. No pertinent surgical history. Family History: History reviewed. No pertinent family history.    Tobacco Screening:  Social History   Tobacco Use  Smoking Status Never  Smokeless Tobacco Never    BH Tobacco Counseling     Are  you interested in Tobacco Cessation Medications?  N/A, patient does not use tobacco products Counseled patient on smoking cessation:  N/A, patient does not use tobacco products Reason Tobacco Screening Not Completed: No value filed.       Social History:  Social History   Substance and Sexual Activity  Alcohol Use No     Social History   Substance and Sexual Activity  Drug Use No    Additional Social History: Marital status: Single Does patient have children?: No                         Allergies:  No Known Allergies Lab Results:  Results for orders placed or performed during the hospital encounter of 06/27/24 (from the past  48 hours)  CBC     Status: Abnormal   Collection Time: 06/27/24  7:59 AM  Result Value Ref Range   WBC 8.9 4.0 - 10.5 K/uL   RBC 4.00 3.87 - 5.11 MIL/uL   Hemoglobin 11.0 (L) 12.0 - 15.0 g/dL   HCT 66.0 (L) 63.9 - 53.9 %   MCV 84.8 80.0 - 100.0 fL   MCH 27.5 26.0 - 34.0 pg   MCHC 32.4 30.0 - 36.0 g/dL   RDW 85.4 88.4 - 84.4 %   Platelets 213 150 - 400 K/uL   nRBC 0.0 0.0 - 0.2 %    Comment: Performed at [facility], [address] [place], [place] [address]  Comprehensive metabolic panel with GFR     Status: Abnormal   Collection Time: 06/27/24  7:59 AM  Result Value Ref Range   Sodium 142 135 - 145 mmol/L   Potassium 3.5 3.5 - 5.1 mmol/L   Chloride 104 98 - 111 mmol/L   CO2 26 22 - 32 mmol/L   Glucose, Bld 93 70 - 99 mg/dL    Comment: Glucose reference range applies only to samples taken after fasting for at least 8 hours.   BUN 12 6 - 20 mg/dL   Creatinine, Ser 9.42 0.44 - 1.00 mg/dL   Calcium 9.2 8.9 - 89.6 mg/dL   Total Protein 6.9 6.5 - 8.1 g/dL   Albumin 4.4 3.5 - 5.0 g/dL   AST 21 15 - 41 U/L   ALT 18 0 - 44 U/L   Alkaline Phosphatase 37 (L) 38 - 126 U/L   Total Bilirubin 0.4 0.0 - 1.2 mg/dL   GFR, Estimated >39 >39 mL/min    Comment: (NOTE) Calculated using the CKD-EPI Creatinine Equation (2021)    Anion gap 12 5 - 15    Comment: Performed at [facility], [address] [place], [place] [address]  Acetaminophen level     Status: Abnormal   Collection Time: 06/27/24  7:59 AM  Result Value Ref Range   Acetaminophen (Tylenol), Serum <10 (L) 10 - 30 ug/mL    Comment: (NOTE) Toxic concentrations can be more effectively related to post dose interval; > 200, > 100, and > 50 ug/mL serum concentrations correspond to toxic concentrations at 4, 8, and 12 hours post dose, respectively.  Performed at [facility], [address] [place], [place] [address]   Salicylate level     Status: Abnormal   Collection Time: 06/27/24  7:59 AM  Result Value Ref Range    Salicylate Lvl <7.0 (L) 7.0 - 30.0 mg/dL    Comment: Performed at [facility], [address] [place], [place] [address]  Ethanol     Status: None  Collection Time: 06/27/24  7:59 AM  Result Value Ref Range   Alcohol, Ethyl (B) <15 <15 mg/dL    Comment: (NOTE) For medical purposes only. Performed at [facility], [address], [place], [place] [address]   hCG, serum, qualitative     Status: None   Collection Time: 06/27/24  7:59 AM  Result Value Ref Range   Preg, Serum NEGATIVE NEGATIVE    Comment:        THE SENSITIVITY OF THIS METHODOLOGY IS >10 mIU/mL. Performed at [facility], [address], [place], [place] [address]   Urine Drug Screen     Status: None   Collection Time: 06/27/24  9:33 AM  Result Value Ref Range   Opiates NEGATIVE NEGATIVE   Cocaine NEGATIVE NEGATIVE   Benzodiazepines NEGATIVE NEGATIVE   Amphetamines NEGATIVE NEGATIVE   Tetrahydrocannabinol NEGATIVE NEGATIVE   Barbiturates NEGATIVE NEGATIVE   Methadone Scn, Ur NEGATIVE NEGATIVE   Fentanyl NEGATIVE NEGATIVE    Comment: (NOTE) Drug screen is for Medical Purposes only. Positive results are preliminary only. If confirmation is needed, notify lab within 5 days.  Drug Class                 Cutoff (ng/mL) Amphetamine and metabolites 1000 Barbiturate and metabolites 200 Benzodiazepine              200 Opiates and metabolites     300 Cocaine and metabolites     300 THC                         50 Fentanyl                    5 Methadone                   300  Trazodone is metabolized in vivo to several metabolites,  including pharmacologically active m-CPP, which is excreted in the  urine.  Immunoassay screens for amphetamines and MDMA have potential  cross-reactivity with these compounds and may provide false positive  result.  Performed at [facility], [address], [place], [place] [address]     Blood Alcohol level:  Lab Results  Component Value Date   [facility] <15 06/27/2024    Metabolic  Disorder Labs:  No results found for: HGBA1C, MPG No results found for: PROLACTIN No results found for: CHOL, TRIG, HDL, CHOLHDL, VLDL, LDLCALC  Current Medications: Current Facility-Administered Medications  Medication Dose Route Frequency Provider Last Rate Last Admin   acetaminophen (TYLENOL) tablet 650 mg  650 mg Oral Q6H PRN Allen, Tina L, FNP       alum & mag hydroxide-simeth (MAALOX/MYLANTA) 200-200-20 MG/5ML suspension 30 mL  30 mL Oral Q4H PRN Allen, Tina L, FNP       haloperidol (HALDOL) tablet 5 mg  5 mg Oral TID PRN Allen, Tina L, FNP       And   diphenhydrAMINE (BENADRYL) capsule 50 mg  50 mg Oral TID PRN Allen, Tina L, FNP       haloperidol lactate (HALDOL) injection 5 mg  5 mg Intramuscular TID PRN Allen, Tina L, FNP       And   diphenhydrAMINE (BENADRYL) injection 50 mg  50 mg Intramuscular TID PRN Dasie Ellouise CROME, FNP       And   LORazepam  (ATIVAN ) injection 2 mg  2 mg Intramuscular TID PRN Dasie Ellouise CROME, FNP  haloperidol lactate (HALDOL) injection 10 mg  10 mg Intramuscular TID PRN Allen, Tina L, FNP       And   diphenhydrAMINE (BENADRYL) injection 50 mg  50 mg Intramuscular TID PRN Dasie Ellouise CROME, FNP       And   LORazepam  (ATIVAN ) injection 2 mg  2 mg Intramuscular TID PRN Allen, Tina L, FNP       escitalopram  (LEXAPRO ) tablet 10 mg  10 mg Oral Daily Allen, Tina L, FNP   10 mg at 06/28/24 0836   feeding supplement (ENSURE PLUS HIGH PROTEIN) liquid 237 mL  237 mL Oral BID BM Pashayan, Alexander S, DO   237 mL at 06/28/24 1109   hydrOXYzine  (ATARAX ) tablet 25 mg  25 mg Oral TID PRN Allen, Tina L, FNP   25 mg at 06/27/24 2116   magnesium hydroxide (MILK OF MAGNESIA) suspension 30 mL  30 mL Oral Daily PRN Dasie Ellouise CROME, FNP       pantoprazole  (PROTONIX ) EC tablet 20 mg  20 mg Oral Daily Allen, Tina L, FNP   20 mg at 06/28/24 0836   traZODone (DESYREL) tablet 50 mg  50 mg Oral QHS PRN Allen, Tina L, FNP   50 mg at 06/27/24 2116   PTA  Medications: Medications Prior to Admission  Medication Sig Dispense Refill Last Dose/Taking   busPIRone  (BUSPAR ) 10 MG tablet Take 0.5 tablets (5 mg total) by mouth 3 (three) times daily. (Patient not taking: Reported on 06/27/2024) 30 tablet 1    desvenlafaxine  (PRISTIQ ) 50 MG 24 hr tablet Take 1 tablet (50 mg total) by mouth daily. 90 tablet 1    dicyclomine (BENTYL) 20 MG tablet Take 1 tablet (20 mg total) by mouth 2 (two) times daily. 20 tablet 0    ondansetron  (ZOFRAN -ODT) 4 MG disintegrating tablet Take 1 tablet (4 mg total) by mouth every 8 (eight) hours as needed for vomiting. 30 tablet 0    pantoprazole  (PROTONIX ) 20 MG tablet Take 1 tablet (20 mg total) by mouth daily. 30 tablet 0    promethazine (PHENERGAN) 25 MG suppository Place 1 suppository (25 mg total) rectally every 6 (six) hours as needed for nausea or vomiting. (Patient not taking: Reported on 06/27/2024) 12 each 0     AIMS:  ,  ,  ,  ,  ,  ,    Musculoskeletal: Strength & Muscle Tone: within normal limits Gait & Station: normal Patient leans: N/A            Psychiatric Specialty Exam:  Presentation  General Appearance: Disheveled  Eye Contact:Fair  Speech:Normal Rate  Speech Volume:Normal  Handedness:No data recorded  Mood and Affect  Mood:Depressed; Anxious  Affect:Blunt   Thought Process  Thought Processes:Coherent  Duration of Psychotic Symptoms:1 to 2 years Past Diagnosis of Schizophrenia or Psychoactive disorder: No data recorded Descriptions of Associations:No data recorded Orientation:Full (Time, Place and Person)  Thought Content:Logical  Hallucinations:Hallucinations: None  Ideas of Reference:None  Suicidal Thoughts:Suicidal Thoughts: Yes, Passive SI Passive Intent and/or Plan: Without Intent; Without Plan  Homicidal Thoughts:Homicidal Thoughts: No   Sensorium  Memory:Immediate Good; Recent Good; Remote Good  Judgment:Good  Insight:Good   Executive Functions   Concentration:Good  Attention Span:Good  Recall:Good  Fund of Knowledge:Good  Language:Good   Psychomotor Activity  Psychomotor Activity:Psychomotor Activity: Normal   Assets  Assets:Communication Skills; Desire for Improvement; Financial Resources/Insurance; Housing; Social Support; Transportation; Vocational/Educational   Sleep  Sleep:Sleep: Poor  Estimated Sleeping Duration (Last 24 Hours): 7.50 hours (  Due to Daylight Saving Time, the duration displayed may not accurately represent documentation during the time change interval)   Physical Exam: Physical Exam ROS Blood pressure (!) 130/97, pulse 97, temperature 98.7 F (37.1 C), temperature source Oral, resp. rate 18, height 5' 7 (1.702 m), weight 46.3 kg, last menstrual period 06/13/2024, SpO2 100%. Body mass index is 15.98 kg/m.    Diagnosis Principal Problem:   MDD (major depressive disorder), recurrent severe, without psychosis (HCC) Active Problems:   Generalized anxiety disorder   Suicide attempt (HCC)   OCD (obsessive compulsive disorder)  Melissa Santana is a 23 year old female who presented to our service for suicide attempt by bilateral wrist lacerations in the context of uncontrolled major depression, severe anxiety about her health and obsessive/compulsive thoughts related to her health.  Mood has been deteriorating over the last several months characterized by anhedonia, severe anxiety and OCD related to GI distress.  She has lost 20 pounds over the last several weeks.  This culminated with a suicide attempt.  She has previous trials of Lexapro  with reported good benefit though it has been 1 to 3 years since last medication trials.  She has no history of mania or psychosis, and there is no family history of bipolar disorder.  I discussed treating the patient for MDD, GAD, OCD.  We will restart Lexapro  and supplement with olanzapine (for severe anxiety, mood stabilization, impaired appetite and impaired  sleep ).   Treatment Plan Summary: Daily contact with patient to assess and evaluate symptoms and progress in treatment  Observation Level/Precautions:  15 minute checks  Laboratory:     Ordering TSH Ordering lipid panel Ordering hemoglobin A1c CBC within normal limits CMP within normal limits Urine pregnancy negative BAL negative UDS positive for benzodiazepines EKG on 06/27/2024: Rate 90, QTc 465, normal sinus rhythm   Psychotherapy: Group therapy  Medications:    -Start Lexapro  10 mg for depression and anxiety  - Start olanzapine 2.5 mg qhs for depression adjunct, severe anxiety and OCD as well as impaired sleep and impaired appetite     Consultations: Social work  Discharge Concerns: Eating  Estimated LOS: 5 to 7 days  Other:     Physician Treatment Plan for Primary Diagnosis: MDD (major depressive disorder), recurrent severe, without psychosis (HCC) Long Term Goal(s): Improvement in symptoms so as ready for discharge  Short Term Goals: Ability to identify changes in lifestyle to reduce recurrence of condition will improve, Ability to verbalize feelings will improve, Ability to disclose and discuss suicidal ideas, Ability to demonstrate self-control will improve, Ability to identify and develop effective coping behaviors will improve, Ability to maintain clinical measurements within normal limits will improve, and Compliance with prescribed medications will improve  Physician Treatment Plan for Secondary Diagnosis: Principal Problem:   MDD (major depressive disorder), recurrent severe, without psychosis (HCC) Active Problems:   Generalized anxiety disorder   Suicide attempt (HCC)   OCD (obsessive compulsive disorder)  Long Term Goal(s): Improvement in symptoms so as ready for discharge  Short Term Goals: Ability to identify changes in lifestyle to reduce recurrence of condition will improve, Ability to verbalize feelings will improve, Ability to disclose and discuss  suicidal ideas, Ability to demonstrate self-control will improve, Ability to identify and develop effective coping behaviors will improve, Ability to maintain clinical measurements within normal limits will improve, and Compliance with prescribed medications will improve  I certify that inpatient services furnished can reasonably be expected to improve the patient's condition.    Lamar Handler  Jama Slain, DO 11/7/202512:17 PM

## 2024-06-28 NOTE — Group Note (Signed)
 Date:  06/28/2024 Time:  1:28 PM  Group Topic/Focus: Recreational Therapy Patient use their thinking skills to guess the lyrics of the song     Participation Level:  Active  Participation Quality:  Appropriate  Affect:  Appropriate  Cognitive:  Alert and Appropriate  Insight: Appropriate  Engagement in Group:  Engaged  Modes of Intervention:  Problem-solving  Additional Comments:  Patient attended  [employer] 06/28/2024, 1:28 PM

## 2024-06-28 NOTE — Group Note (Signed)
 Date:  06/28/2024 Time:  11:01 AM  Group Topic/Focus: Self-Regulation assessment and goals orientation  Goals Group:   The focus of this group is to help patients establish daily goals to achieve during treatment and discuss how the patient can incorporate goal setting into their daily lives to aide in recovery. Orientation:   The focus of this group is to educate the patient on the purpose and policies of crisis stabilization and provide a format to answer questions about their admission.  The group details unit policies and expectations of patients while admitted.    Participation Level:  Active  Participation Quality:  Appropriate  Affect:  Appropriate  Cognitive:  Alert and Appropriate  Insight: Appropriate  Engagement in Group:  Engaged  Modes of Intervention:  Discussion and Orientation  Additional Comments:  Patient did attend and was engaged with activity. Khoen Genet M Anthea Udovich 06/28/2024, 11:01 AM

## 2024-06-28 NOTE — BH IP Treatment Plan (Signed)
 Interdisciplinary Treatment and Diagnostic Plan Update  06/28/2024 Time of Session: 10:55 AM Melissa Santana MRN: [id number]  Principal Diagnosis: MDD (major depressive disorder), recurrent severe, without psychosis (HCC)  Secondary Diagnoses: Principal Problem:   MDD (major depressive disorder), recurrent severe, without psychosis (HCC) Active Problems:   Generalized anxiety disorder   Suicide attempt (HCC)   OCD (obsessive compulsive disorder)   Current Medications:  Current Facility-Administered Medications  Medication Dose Route Frequency Provider Last Rate Last Admin   acetaminophen (TYLENOL) tablet 650 mg  650 mg Oral Q6H PRN Allen, Tina L, FNP       alum & mag hydroxide-simeth (MAALOX/MYLANTA) 200-200-20 MG/5ML suspension 30 mL  30 mL Oral Q4H PRN Allen, Tina L, FNP       haloperidol (HALDOL) tablet 5 mg  5 mg Oral TID PRN Dasie Ellouise CROME, FNP       And   diphenhydrAMINE (BENADRYL) capsule 50 mg  50 mg Oral TID PRN Allen, Tina L, FNP       haloperidol lactate (HALDOL) injection 5 mg  5 mg Intramuscular TID PRN Dasie Ellouise CROME, FNP       And   diphenhydrAMINE (BENADRYL) injection 50 mg  50 mg Intramuscular TID PRN Dasie Ellouise CROME, FNP       And   LORazepam  (ATIVAN ) injection 2 mg  2 mg Intramuscular TID PRN Allen, Tina L, FNP       haloperidol lactate (HALDOL) injection 10 mg  10 mg Intramuscular TID PRN Dasie Ellouise CROME, FNP       And   diphenhydrAMINE (BENADRYL) injection 50 mg  50 mg Intramuscular TID PRN Dasie Ellouise CROME, FNP       And   LORazepam  (ATIVAN ) injection 2 mg  2 mg Intramuscular TID PRN Dasie Ellouise CROME, FNP       escitalopram  (LEXAPRO ) tablet 10 mg  10 mg Oral Daily Dasie Ellouise CROME, FNP   10 mg at 06/28/24 0836   feeding supplement (ENSURE PLUS HIGH PROTEIN) liquid 237 mL  237 mL Oral BID BM Pashayan, Alexander S, DO   237 mL at 06/28/24 1615   hydrOXYzine  (ATARAX ) tablet 25 mg  25 mg Oral TID PRN Allen, Tina L, FNP   25 mg at 06/27/24 2116   magnesium hydroxide (MILK OF  MAGNESIA) suspension 30 mL  30 mL Oral Daily PRN Dasie Ellouise CROME, FNP       OLANZapine (ZYPREXA) tablet 2.5 mg  2.5 mg Oral Q2000 Chandra Charleston Christian Lee, DO       pantoprazole  (PROTONIX ) EC tablet 20 mg  20 mg Oral Daily Dasie Ellouise CROME, FNP   20 mg at 06/28/24 0836   traZODone (DESYREL) tablet 50 mg  50 mg Oral QHS PRN Allen, Tina L, FNP   50 mg at 06/27/24 2116   PTA Medications: Medications Prior to Admission  Medication Sig Dispense Refill Last Dose/Taking   busPIRone  (BUSPAR ) 10 MG tablet Take 0.5 tablets (5 mg total) by mouth 3 (three) times daily. (Patient not taking: Reported on 06/27/2024) 30 tablet 1    desvenlafaxine  (PRISTIQ ) 50 MG 24 hr tablet Take 1 tablet (50 mg total) by mouth daily. 90 tablet 1    dicyclomine (BENTYL) 20 MG tablet Take 1 tablet (20 mg total) by mouth 2 (two) times daily. 20 tablet 0    ondansetron  (ZOFRAN -ODT) 4 MG disintegrating tablet Take 1 tablet (4 mg total) by mouth every 8 (eight) hours as needed for vomiting. 30 tablet 0  pantoprazole  (PROTONIX ) 20 MG tablet Take 1 tablet (20 mg total) by mouth daily. 30 tablet 0    promethazine (PHENERGAN) 25 MG suppository Place 1 suppository (25 mg total) rectally every 6 (six) hours as needed for nausea or vomiting. (Patient not taking: Reported on 06/27/2024) 12 each 0     Patient Stressors: Health problems    Patient Strengths: Manufacturing systems engineer  Supportive family/friends   Treatment Modalities: Medication Management, Group therapy, Case management,  1 to 1 session with clinician, Psychoeducation, Recreational therapy.   Physician Treatment Plan for Primary Diagnosis: MDD (major depressive disorder), recurrent severe, without psychosis (HCC) Long Term Goal(s): Improvement in symptoms so as ready for discharge   Short Term Goals: Ability to identify changes in lifestyle to reduce recurrence of condition will improve Ability to verbalize feelings will improve Ability to disclose and discuss suicidal  ideas Ability to demonstrate self-control will improve Ability to identify and develop effective coping behaviors will improve Ability to maintain clinical measurements within normal limits will improve Compliance with prescribed medications will improve  Medication Management: Evaluate patient's response, side effects, and tolerance of medication regimen.  Therapeutic Interventions: 1 to 1 sessions, Unit Group sessions and Medication administration.  Evaluation of Outcomes: Not Progressing  Physician Treatment Plan for Secondary Diagnosis: Principal Problem:   MDD (major depressive disorder), recurrent severe, without psychosis (HCC) Active Problems:   Generalized anxiety disorder   Suicide attempt (HCC)   OCD (obsessive compulsive disorder)  Long Term Goal(s): Improvement in symptoms so as ready for discharge   Short Term Goals: Ability to identify changes in lifestyle to reduce recurrence of condition will improve Ability to verbalize feelings will improve Ability to disclose and discuss suicidal ideas Ability to demonstrate self-control will improve Ability to identify and develop effective coping behaviors will improve Ability to maintain clinical measurements within normal limits will improve Compliance with prescribed medications will improve     Medication Management: Evaluate patient's response, side effects, and tolerance of medication regimen.  Therapeutic Interventions: 1 to 1 sessions, Unit Group sessions and Medication administration.  Evaluation of Outcomes: Not Progressing   RN Treatment Plan for Primary Diagnosis: MDD (major depressive disorder), recurrent severe, without psychosis (HCC) Long Term Goal(s): Knowledge of disease and therapeutic regimen to maintain health will improve  Short Term Goals: Ability to remain free from injury will improve, Ability to verbalize frustration and anger appropriately will improve, Ability to demonstrate self-control,  Ability to participate in decision making will improve, Ability to verbalize feelings will improve, Ability to disclose and discuss suicidal ideas, Ability to identify and develop effective coping behaviors will improve, and Compliance with prescribed medications will improve  Medication Management: RN will administer medications as ordered by provider, will assess and evaluate patient's response and provide education to patient for prescribed medication. RN will report any adverse and/or side effects to prescribing provider.  Therapeutic Interventions: 1 on 1 counseling sessions, Psychoeducation, Medication administration, Evaluate responses to treatment, Monitor vital signs and CBGs as ordered, Perform/monitor CIWA, COWS, AIMS and Fall Risk screenings as ordered, Perform wound care treatments as ordered.  Evaluation of Outcomes: Not Progressing   LCSW Treatment Plan for Primary Diagnosis: MDD (major depressive disorder), recurrent severe, without psychosis (HCC) Long Term Goal(s): Safe transition to appropriate next level of care at discharge, Engage patient in therapeutic group addressing interpersonal concerns.  Short Term Goals: Engage patient in aftercare planning with referrals and resources, Increase social support, Increase ability to appropriately verbalize feelings, Increase emotional regulation, Facilitate  acceptance of mental health diagnosis and concerns, Facilitate patient progression through stages of change regarding substance use diagnoses and concerns, Identify triggers associated with mental health/substance abuse issues, and Increase skills for wellness and recovery  Therapeutic Interventions: Assess for all discharge needs, 1 to 1 time with Social worker, Explore available resources and support systems, Assess for adequacy in community support network, Educate family and significant other(s) on suicide prevention, Complete Psychosocial Assessment, Interpersonal group  therapy.  Evaluation of Outcomes: Not Progressing   Progress in Treatment: Attending groups: Yes. Participating in groups: Yes. Taking medication as prescribed: Yes. Toleration medication: Yes. Family/Significant other contact made: No, will contact:  [name], mom & [name], dad, [phone] Patient understands diagnosis: Yes. Discussing patient identified problems/goals with staff: Yes. Medical problems stabilized or resolved: Yes. Denies suicidal/homicidal ideation: Yes. Issues/concerns per patient self-inventory: No.  New problem(s) identified:  No  New Short Term/Long Term Goal(s):    medication stabilization, elimination of SI thoughts, development of comprehensive mental wellness plan.    Patient Goals:  I want to work on my mental health and managing anxiety.  Discharge Plan or Barriers:  Patient recently admitted. CSW will continue to follow and assess for appropriate referrals and possible discharge planning.   Reason for Continuation of Hospitalization: Anxiety Depression Medical Issues Medication stabilization  Estimated Length of Stay:  5 - 7 days  Last 3 [place] Suicide Severity Risk Score: Flowsheet Row [facility] (Current[facility] Most recent reading at 06/27/2024  1:00 PM ED from 06/27/2024 in [facility] at [facility] Most recent reading at 06/27/2024  6:29 AM ED from 06/23/2024 in [facility] Emergency Department at [facility] Most recent reading at 06/23/2024  8:51 PM  C-SSRS RISK CATEGORY High Risk High Risk No Risk    Last PHQ 2/9 Scores:    06/25/2024    2:35 PM 12/30/2020    4:02 PM 11/12/2020    2:45 PM  Depression screen PHQ 2/9  Decreased Interest 3 2 3   Down, Depressed, Hopeless 2 2 1   PHQ - 2 Score 5 4 4   Altered sleeping 2 2 3   Tired, decreased energy 1 2 3   Change in appetite 1 3 3   Feeling bad or failure about yourself  2 1 1    Trouble concentrating 2 1 1   Moving slowly or fidgety/restless 2 1 2   Suicidal thoughts 0 0 0  PHQ-9 Score 15  14  17    Difficult doing work/chores  Not difficult at all Very difficult     Data saved with a previous flowsheet row definition    Scribe for Treatment Team: Keyvon Herter O Adrine Hayworth, LCSWA 06/28/2024 5:25 PM

## 2024-06-28 NOTE — [facility] Suicide Risk Assessment (Signed)
 [facility] Admission Suicide Risk Assessment   Nursing information obtained from:  Patient Demographic factors:  Unemployed Current Mental Status:  Self-harm behaviors Loss Factors:  NA Historical Factors:  Impulsivity Risk Reduction Factors:  Positive social support, Positive coping skills or problem solving skills  Total Time spent with patient: 1 hour Principal Problem: MDD (major depressive disorder), recurrent severe, without psychosis (HCC) Diagnosis:  Principal Problem:   MDD (major depressive disorder), recurrent severe, without psychosis (HCC) Active Problems:   Generalized anxiety disorder   Suicide attempt (HCC)   OCD (obsessive compulsive disorder)  Subjective Data: Melissa Santana is a 23 year old female who presented to our service for suicide attempt by bilateral wrist lacerations in the context of uncontrolled major depression, severe anxiety about her health and obsessive/compulsive thoughts related to her health.  Mood has been deteriorating over the last several months characterized by anhedonia, severe anxiety and OCD related to GI distress.  She has lost 20 pounds over the last several weeks.  This culminated with a suicide attempt.  She has previous trials of Lexapro  with reported good benefit though it has been 1 to 3 years since last medication trials.  She has no history of mania or psychosis, and there is no family history of bipolar disorder.  I discussed treating the patient for MDD, GAD, OCD.  We will restart Lexapro  and supplement with olanzapine.  Continued Clinical Symptoms:  Alcohol Use Disorder Identification Test Final Score (AUDIT): 0 The Alcohol Use Disorders Identification Test, Guidelines for Use in Primary Care, Second Edition.  [employer] [facility]). Score between 0-7:  no or low risk or alcohol related problems. Score between 8-15:  moderate risk of alcohol related problems. Score between 16-19:  high risk of alcohol related problems. Score  20 or above:  warrants further diagnostic evaluation for alcohol dependence and treatment.   CLINICAL FACTORS:   Severe Anxiety and/or Agitation Depression:   Anhedonia Hopelessness Severe Obsessive-Compulsive Disorder More than one psychiatric diagnosis Previous Psychiatric Diagnoses and Treatments   Musculoskeletal: Strength & Muscle Tone: within normal limits Gait & Station: normal Patient leans: N/A  Psychiatric Specialty Exam:  Presentation  General Appearance: Disheveled  Eye Contact:Fair  Speech:Normal Rate  Speech Volume:Normal  Handedness:No data recorded  Mood and Affect  Mood:Depressed; Anxious  Affect:Blunt   Thought Process  Thought Processes:Coherent  Descriptions of Associations:No data recorded Orientation:Full (Time, Place and Person)  Thought Content:Logical  History of Schizophrenia/Schizoaffective disorder:No data recorded Duration of Psychotic Symptoms:No data recorded Hallucinations:Hallucinations: None  Ideas of Reference:None  Suicidal Thoughts:Suicidal Thoughts: Yes, Passive SI Passive Intent and/or Plan: Without Intent; Without Plan  Homicidal Thoughts:Homicidal Thoughts: No   Sensorium  Memory:Immediate Good; Recent Good; Remote Good  Judgment:Good  Insight:Good   Executive Functions  Concentration:Good  Attention Span:Good  Recall:Good  Fund of Knowledge:Good  Language:Good   Psychomotor Activity  Psychomotor Activity:Psychomotor Activity: Normal   Assets  Assets:Communication Skills; Desire for Improvement; Financial Resources/Insurance; Housing; Agricultural Engineer; Vocational/Educational   Sleep  Sleep:Sleep: Poor    Physical Exam: Physical Exam Constitutional:      Comments: Very thin, borderline appears malnourished, BMI 15.8  HENT:     Head: Normocephalic.     Nose: Nose normal.  Musculoskeletal:        General: Normal range of motion.     Cervical back: Normal range of  motion.  Skin:    Comments: Bilateral wrist lacerations  Neurological:     General: No focal deficit present.  Mental Status: She is alert and oriented to person, place, and time.  Psychiatric:        Behavior: Behavior normal.    ROS Blood pressure (!) 130/97, pulse 97, temperature 98.7 F (37.1 C), temperature source Oral, resp. rate 18, height 5' 7 (1.702 m), weight 46.3 kg, last menstrual period 06/13/2024, SpO2 100%. Body mass index is 15.98 kg/m.   COGNITIVE FEATURES THAT CONTRIBUTE TO RISK:  None    SUICIDE RISK:  Moderate  PLAN OF CARE: Medication management, group therapy and case management  I certify that inpatient services furnished can reasonably be expected to improve the patient's condition.   Lamar Handler Jama Slain, DO 06/28/2024, 12:10 PM

## 2024-06-28 NOTE — Progress Notes (Signed)
   06/28/24 2000  Psych Admission Type (Psych Patients Only)  Admission Status Voluntary  Psychosocial Assessment  Patient Complaints None  Eye Contact Fair  Facial Expression Anxious  Affect Anxious  Speech Logical/coherent  Interaction Cautious  Motor Activity Fidgety;Pacing (pacing in room)  Appearance/Hygiene In scrubs  Behavior Characteristics Cooperative;Anxious  Mood Anxious  Thought Process  Coherency WDL  Content WDL  Delusions None reported or observed  Perception WDL  Hallucination None reported or observed  Judgment Impaired  Confusion None  Danger to Self  Current suicidal ideation? Denies  Agreement Not to Harm Self Yes  Description of Agreement verbal  Danger to Others  Danger to Others None reported or observed

## 2024-06-28 NOTE — [facility] Suicide Risk Assessment (Signed)
 [facility] INPATIENT:  Family/Significant Other Suicide Prevention Education  Suicide Prevention Education:  Education Completed;  Melissa Santana, mom & Melissa Santana, dad, [phone],   has been identified by the patient as the family member/significant other with whom the patient will be residing, and identified as the person(s) who will aid the patient in the event of a mental health crisis (suicidal ideations/suicide attempt).  With written consent from the patient, the family member/significant other has been provided the following suicide prevention education, prior to the and/or following the discharge of the patient.  The suicide prevention education provided includes the following: Suicide risk factors Suicide prevention and interventions National Suicide Hotline telephone number [facility] assessment telephone number [facility] Emergency Assistance 911 [employer] and/or Residential Mobile Crisis Unit telephone number  Request made of family/significant other to: Remove weapons (e.g., guns, rifles, knives), all items previously/currently identified as safety concern.   Remove drugs/medications (over-the-counter, prescriptions, illicit drugs), all items previously/currently identified as a safety concern.  The family member/significant other verbalizes understanding of the suicide prevention education information provided.  The family member/significant other agrees to remove the items of safety concern listed above.  Melissa Santana 06/28/2024, 10:30 AM

## 2024-06-28 NOTE — Progress Notes (Signed)
   06/28/24 0800  Psych Admission Type (Psych Patients Only)  Admission Status Voluntary  Psychosocial Assessment  Patient Complaints Anxiety;Depression  Eye Contact Fair  Facial Expression Anxious  Affect Anxious;Depressed  Speech Logical/coherent  Interaction Cautious  Motor Activity Fidgety;Slow  Appearance/Hygiene In scrubs  Behavior Characteristics Cooperative;Anxious  Mood Depressed;Anxious  Thought Process  Coherency WDL  Content WDL  Delusions None reported or observed  Perception WDL  Hallucination None reported or observed  Judgment Impaired  Confusion None  Danger to Self  Current suicidal ideation? Denies  Agreement Not to Harm Self Yes  Description of Agreement verbal  Danger to Others  Danger to Others None reported or observed

## 2024-06-28 NOTE — Group Note (Signed)
 Recreation Therapy Group Note   Group Topic:Leisure Education  Group Date: 06/28/2024 Start Time: 0940 End Time: 1012 Facilitators: Sheri Prows-McCall, LRT,CTRS Location: [address] Dayroom   Group Topic: Leisure Education  Goal Area(s) Addresses:  Patient will identify positive leisure activities.  Patient will identify one positive benefit of participation in leisure activities.   Behavioral Response:   Intervention: Leisure Group Game  Activity: Patient, MHT, and LRT participated in playing a trivia game of Guess the [place]. Teams took turns spinning the wheel. Whatever category ([address] Pop, Dance, Hip Hop, Indie and R&B) the wheel landed on, LRT read the lyric and that team would guess the missing lyric. The team with the most points wins the game.  Education:  Leisure Exposure, Pharmacist, Community, Discharge Planning  Education Outcome: Acknowledges education/In group clarification offered/Needs additional education   Affect/Mood: N/A   Participation Level: Did not attend    Clinical Observations/Individualized Feedback:      Plan: Continue to engage patient in RT group sessions 2-3x/week.   Hunt Zajicek-McCall, LRT,CTRS 06/28/2024 11:52 AM

## 2024-06-28 NOTE — [facility] Counselor (Signed)
 Adult Comprehensive Assessment  Patient ID: Melissa Santana, female   DOB: 2000-09-12, 24 y.o.   MRN: [id number]  Information Source: Information source: Patient  Current Stressors:  Patient states their primary concerns and needs for treatment are:: The patient stated anxiety and depression. Stating that she was having SI and that she has been having problems with her physical health that she been taken a tole on her. Patient states their goals for this hospitilization and ongoing recovery are:: The patient stated to get better metally with anxiety. Educational / Learning stressors: None reported Employment / Job issues: The patient stated wanting to get better so she can work. Family Relationships: The patient stated normal stressors. Financial / Lack of resources (include bankruptcy): None reported Housing / Lack of housing: None reported Physical health (include injuries & life threatening diseases): The patient stated stomah issues. Social relationships: None reported Substance abuse: None reported Bereavement / Loss: None reported  Living/Environment/Situation:  Living Arrangements: Parent, Other relatives Living conditions (as described by patient or guardian): The patient stated house. Who else lives in the home?: The patient stated parents and brothers. How long has patient lived in current situation?: The patient stated pretty much whole life. What is atmosphere in current home: Comfortable  Family History:  Marital status: Single Does patient have children?: No  Childhood History:  By whom was/is the patient raised?: Both parents Description of patient's relationship with caregiver when they were a child: The patient stated that the relationship was pretty okay. Patient's description of current relationship with people who raised him/her: The patient stated that its been better. How were you disciplined when you got in trouble as a child/adolescent?: The patient stated  spankings and groundings. Does patient have siblings?: Yes Number of Siblings: 2 Description of patient's current relationship with siblings: The patient stated they are younger but its an pretty okay relationship. Did patient suffer any verbal/emotional/physical/sexual abuse as a child?: No Did patient suffer from severe childhood neglect?: No Has patient ever been sexually abused/assaulted/raped as an adolescent or adult?: No Was the patient ever a victim of a crime or a disaster?: No Witnessed domestic violence?: No Has patient been affected by domestic violence as an adult?: No  Education:  Highest grade of school patient has completed: The patient stated some college. Currently a student?: No Learning disability?: No  Employment/Work Situation:   Employment Situation: Unemployed Patient's Job has Been Impacted by Current Illness: Yes Describe how Patient's Job has Been Impacted: The patient stated that her mental and physcal health has been preventing her from working. 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?: No  Financial Resources:   Financial resources: No income Does patient have a lawyer or guardian?: No  Alcohol/Substance Abuse:   What has been your use of drugs/alcohol within the last 12 months?: The patient stated none. If attempted suicide, did drugs/alcohol play a role in this?: No Alcohol/Substance Abuse Treatment Hx: Denies past history Has alcohol/substance abuse ever caused legal problems?: No  Social Support System:   Patient's Community Support System: Good Describe Community Support System: The patient stated its pretty good. Type of faith/religion: None reported  Leisure/Recreation:   Do You Have Hobbies?: Yes Leisure and Hobbies: The patient stated she likes to watch  movies.  Strengths/Needs:   Patient states these barriers may affect/interfere  with their treatment: The patient stated none. Patient states these barriers may affect their return to the community: The patient stated none. Other important information patient would like considered in planning for their treatment: The patient stated nothing she can think of.  Discharge Plan:   Currently receiving community mental health services: No Patient states concerns and preferences for aftercare planning are: None reported Patient states they will know when they are safe and ready for discharge when: The patient stated when she ffeels her anxiety is under control. Does patient have access to transportation?: Yes Does patient have financial barriers related to discharge medications?: No Patient description of barriers related to discharge medications: The patient stated none. Will patient be returning to same living situation after discharge?: Yes  Summary/Recommendations:   The patient is a 23 year old female from [place] [facility] [place]) who presented to the ED with chief compliant of suicide attempt. The patient has been experiencing an increase of anxiety and depression the past few weeks. The patient stated that she had been having SI without a plan. The patient denies any recent SI. The patient stated reports problems with her physical health being her main stressor. Stating that she has been having stomach issues. The patient carries the psychiatric diagnoses of MDD, recurrent severe GAD and has a past medical history of menorrhagia, dysmenorrhea. The reports being unemployed and having no income, including insurance. The patient denies any substance use. Recommendations include crisis stabilization, therapeutic milieu, encourage group attendance and participation, medication management for mood stabilization, and development of a comprehensive mental wellness.    Roselyn GORMAN Lento. 06/28/2024

## 2024-06-28 NOTE — Group Note (Signed)
 Date:  06/28/2024 Time:  3:49 PM  Group Topic/Focus:  Overcoming Stress:   The focus of this group is to define stress and help patients assess their triggers.    Participation Level:  Did Not Attend    Annalee Larch 06/28/2024, 3:49 PM

## 2024-06-28 NOTE — Group Note (Signed)
 Date:  06/28/2024 Time:  1:58 PM  Group Topic/Focus: Social Wellness Developing a Wellness Toolbox:   The focus of this group is to help patients develop a wellness toolbox with skills and strategies to promote recovery upon discharge.    Participation Level:  Active  Participation Quality:  Appropriate  Affect:  Appropriate  Cognitive:  Appropriate  Insight: Appropriate  Engagement in Group:  Engaged  Modes of Intervention:  Education  Additional Comments:  Patient attend  Melissa Santana 06/28/2024, 1:58 PM

## 2024-06-28 NOTE — Plan of Care (Signed)
   Problem: Education: Goal: Knowledge of risk factors and measures for prevention of condition will improve Outcome: Progressing   Problem: Coping: Goal: Psychosocial and spiritual needs will be supported Outcome: Progressing   Problem: Respiratory: Goal: Will maintain a patent airway Outcome: Progressing Goal: Complications related to the disease process, condition or treatment will be avoided or minimized Outcome: Progressing

## 2024-06-29 DIAGNOSIS — F411 Generalized anxiety disorder: Secondary | ICD-10-CM | POA: Diagnosis not present

## 2024-06-29 DIAGNOSIS — F429 Obsessive-compulsive disorder, unspecified: Secondary | ICD-10-CM | POA: Diagnosis not present

## 2024-06-29 DIAGNOSIS — T1491XA Suicide attempt, initial encounter: Secondary | ICD-10-CM | POA: Diagnosis not present

## 2024-06-29 DIAGNOSIS — F332 Major depressive disorder, recurrent severe without psychotic features: Secondary | ICD-10-CM | POA: Diagnosis not present

## 2024-06-29 MED ORDER — OLANZAPINE 5 MG PO TABS
5.0000 mg | ORAL_TABLET | Freq: Every day | ORAL | Status: DC
  Administered 2024-06-29 – 2024-07-01 (×3): 5 mg via ORAL
  Filled 2024-06-29 (×3): qty 1

## 2024-06-29 NOTE — Plan of Care (Signed)
  Problem: Coping: Goal: Psychosocial and spiritual needs will be supported Outcome: Progressing   

## 2024-06-29 NOTE — [facility] Group Notes (Signed)
 Telissa attended the social work group today 06/29/24 from 1000-1100.

## 2024-06-29 NOTE — Group Note (Signed)
 Date:  06/29/2024 Time:  3:38 PM  Group Topic/Focus: Self confidence and peer support  This group participated in a music therapy session utilizing karaoke to promote self-expression, mood elevation, and social connection. The session provided a supportive and safe environment for patients to engage creatively through singing and listening to music. The activity encouraged self-confidence, peer support, and positive social interaction.    Participation Level:  Did Not Attend  Participation Quality:  N/A  Affect:  N/A  Cognitive:  N/A  Insight: None  Engagement in Group:  None  Modes of Intervention:  N/A  Additional Comments:  Melissa Santana did not attend this group.  [name] 06/29/2024, 3:38 PM

## 2024-06-29 NOTE — Group Note (Signed)
 [facility] LCSW Group Therapy Note   Group Date: 06/29/2024 Start Time: 1000 End Time: 1058   Type of Therapy/Topic:  Group Therapy:  Balance in Life  Participation Level:  None   Description of Group:    This group will address the concept of balance and how it feels and looks when one is unbalanced. Patients will be encouraged to process areas in their lives that are out of balance, and identify reasons for remaining unbalanced. Facilitators will guide patients utilizing problem- solving interventions to address and correct the stressor making their life unbalanced. Understanding and applying boundaries will be explored and addressed for obtaining  and maintaining a balanced life. Patients will be encouraged to explore ways to assertively make their unbalanced needs known to significant others in their lives, using other group members and facilitator for support and feedback.  Therapeutic Goals: Patient will identify two or more emotions or situations they have that consume much of in their lives. Patient will identify signs/triggers that life has become out of balance:  Patient will identify two ways to set boundaries in order to achieve balance in their lives:  Patient will demonstrate ability to communicate their needs through discussion and/or role plays  Summary of Patient Progress:      Pt was present and respectful during group, she didn't participate in discussion but she did complete worksheet. Pt was present and able to identify two or more emotions  signs/triggers that life has become out of balance. Patient was able to identify ways to set boundaries in order to achieve balance in their lives. Patient demonstrated the ability to communicate needs through discussion.    Therapeutic Modalities:   Cognitive Behavioral Therapy Solution-Focused Therapy Assertiveness Training   Golda Louder, LCSW

## 2024-06-29 NOTE — Group Note (Signed)
 Date:  06/29/2024 Time:  9:11 PM  Group Topic/Focus:  Wrap-Up Group:   The focus of this group is to help patients review their daily goal of treatment and discuss progress on daily workbooks.    Participation Level:  Minimal  Participation Quality:  Appropriate and Attentive  Affect:  Appropriate  Cognitive:  Alert and Appropriate  Insight: Appropriate and Good  Engagement in Group:  Engaged  Modes of Intervention:  Discussion and Education  Additional Comments:  Pt attended and participated in wrap up group this evening and rated their day a 5/10. Pt stated that they had a rough day, referring to things they are experiencing outside the hospital. Pt goal while they are here it to focus more on their mental health. Pt has no concerns to relay at this time.   Melissa Santana 06/29/2024, 9:11 PM

## 2024-06-29 NOTE — Progress Notes (Addendum)
 [facility] Inpatient Psychiatry Progress Note  Date: 06/29/24 Patient: Melissa Santana MRN: [id number]   Diagnosis Principal Problem:   MDD (major depressive disorder), recurrent severe, without psychosis (HCC) Active Problems:   Generalized anxiety disorder   Suicide attempt (HCC)   OCD (obsessive compulsive disorder)   Assessment and Plan: Melissa Santana is a 23 year old female who presented to our service for suicide attempt by bilateral wrist lacerations in the context of uncontrolled major depression, severe anxiety about her health and obsessive/compulsive thoughts related to her health.  Mood has been deteriorating over the last several months characterized by anhedonia, severe anxiety and OCD related to GI distress.  She has lost 20 pounds over the last several weeks.  This culminated with a suicide attempt.  She has previous trials of Lexapro  with reported good benefit though it has been 1 to 3 years since last medication trials.  She has no history of mania or psychosis, and there is no family history of bipolar disorder.  I discussed treating the patient for MDD, GAD, OCD.  We will restart Lexapro  and supplement with olanzapine (for severe anxiety, mood stabilization, impaired appetite and impaired sleep ).    11/8 - Vital signs stable, labs unremarkable.  TSH returned 1.1.  Lipids and A1c were normal as well I discussed importance of adequate nourishment with the patient today and ordered additional ensures twice daily as meal supplement.  Patient continues to report ongoing GI distress thus initiated proton pump inhibitor.  Will increase Lexapro  and olanzapine given ongoing severe symptoms of MDD, GAD and OCD.  # MDD (major depressive disorder), recurrent severe, without psychosis (HCC), GAD, OCD - Increase Lexapro  to 10 mg MDD, GAD and OCD - Increase olanzapine to 5 mg nightly for severe anxiety, ruminations and unstable mood  # Low BMI, 15.9 -  Ordered Strict I&O and monitor for self induced emesis - Ensures, meal supplement twice daily - TSH 1.1 - Hemoglobin A1c 4.9 - Lipid panel within normal limits - CMP within normal limits - EKG on 06/27/2024: Rate 90, QTc 465, normal sinus rhythm   # GI distress - Start Protonix  20 mg - Maalox - Milk of magnesia  Risk Assessment -mild  Discharge Planning Barriers to discharge: Mood and eating Estimated length of stay: 7 days Predicted Discharge location: Return home with family     Interval History and update:   -Chart reviewed: Labs came back unremarkable.  TSH, hemoglobin A1c and lipid panel were normal -Vital signs stable -Patient took her medications yesterday and denied any side effects from these.  She reports good sleep yesterday which has been poor over the last several weeks.  She reports eating some more today, more than normal.  I informed her that I am adding ensures.  We discussed the importance of nourishment and how this can lead to anorexia, organ failure and depression.  Denies SI, HI and AVH.  Review of systems is positive for ongoing abdominal pain and nausea. -Started proton pump inhibitor today as well and additional meal supplement drinks.   Review of systems -Positive for abdominal pain and nausea -Denies vomiting, diarrhea, constipation, chest pain, shortness of breath, headache, abnormal movements     Physical Exam MSK/Neuro - Normal gait and station    Mental Status Exam Appearance -disheveled, very thin/malnourished, lacerations bilaterally on the wrist and hematoma on the forehead Attitude - Calm, polite, not guarded Speech - normal volume, prosody, inflection Mood -a little better Affect -restricted Thought Process -  LLGD Thought Content - No delusional thoughts expressed SI/HI - Denies  Perceptions - Denies AVH; not RIS Judgement/Insight -adequate Fund of knowledge - WNL Language - No impairments      Lab Results:  Admission on  06/27/2024  Component Date Value Ref Range Status   TSH 06/28/2024 1.130  0.350 - 4.500 uIU/mL Final   Cholesterol 06/28/2024 138  0 - 200 mg/dL Final   Triglycerides 88/92/7974 57  <150 mg/dL Final   HDL 88/92/7974 57  >40 mg/dL Final   Total CHOL/HDL Ratio 06/28/2024 2.4  RATIO Final   VLDL 06/28/2024 11  0 - 40 mg/dL Final   LDL Cholesterol 06/28/2024 70  0 - 99 mg/dL Final   Hgb J8r MFr Bld 06/28/2024 4.9  4.8 - 5.6 % Final   Mean Plasma Glucose 06/28/2024 93.93  mg/dL Final     Vitals: Blood pressure (!) 127/92, pulse 93, temperature 98.7 F (37.1 C), temperature source Oral, resp. rate 18, height 5' 7 (1.702 m), weight 46.3 kg, last menstrual period 06/13/2024, SpO2 99%.    Lamar Handler Jama Slain, DO

## 2024-06-29 NOTE — Progress Notes (Signed)
   06/29/24 2134  Psych Admission Type (Psych Patients Only)  Admission Status Voluntary  Psychosocial Assessment  Patient Complaints Anxiety  Eye Contact Intense  Facial Expression Anxious  Affect Anxious  Speech Soft;Logical/coherent  Interaction Minimal  Motor Activity Fidgety;Restless  Appearance/Hygiene In scrubs  Behavior Characteristics Appropriate to situation  Mood Anxious;Depressed  Thought Process  Coherency WDL  Content WDL  Delusions None reported or observed  Perception WDL  Hallucination None reported or observed  Judgment Impaired  Confusion None  Danger to Self  Current suicidal ideation? Denies  Agreement Not to Harm Self Yes  Description of Agreement verbal  Danger to Others  Danger to Others None reported or observed

## 2024-06-29 NOTE — Group Note (Signed)
 Date:  06/29/2024 Time:  5:42 PM  Group Topic/Focus:  Dimensions of Wellness:   The focus of this group is to introduce the topic of wellness using collage as a creative outlet for expressing emotions, reducing anxiety, fostering group cohesion , and enabling personal insight and healing.    Participation Level:  Active  Participation Quality:  Appropriate and Attentive  Affect:  Appropriate  Cognitive:  Alert and Appropriate  Insight: Improving  Engagement in Group:  Engaged and Improving  Modes of Intervention:  Activity, Discussion, Exploration, Rapport Building, Socialization, and Support    Berwyn GORMAN Acosta 06/29/2024, 5:42 PM

## 2024-06-29 NOTE — Progress Notes (Signed)
(  Sleep Hours) - 7.75  (Any PRNs that were needed, meds refused, or side effects to meds)- hydroxyzine  25 mg, trazodone 50 mg with good effect  (Any disturbances and when (visitation, over night)- none  (Concerns raised by the patient)- none  (SI/HI/AVH)- denies, wounds to right and left wrist are healing without redness or swelling

## 2024-06-29 NOTE — Group Note (Signed)
 Date:  06/29/2024 Time:  12:46 AM  Group Topic/Focus:  Alcoholics Anonymous (AA) Meeting    Participation Level:  Active  Participation Quality:  Appropriate  Affect:  Appropriate  Cognitive:  Appropriate  Insight: Appropriate  Engagement in Group:  Engaged  Modes of Intervention:  Discussion, Socialization, and Support  Additional Comments:  Patient attended AA  Eward Mace 06/29/2024, 12:46 AM

## 2024-06-29 NOTE — Progress Notes (Signed)
   06/29/24 0900  Psych Admission Type (Psych Patients Only)  Admission Status Voluntary  Psychosocial Assessment  Patient Complaints Anxiety (2/10 refused prn)  Eye Contact Intense  Facial Expression Anxious  Affect Anxious  Speech Rapid  Interaction Cautious  Motor Activity Fidgety;Pacing  Appearance/Hygiene In scrubs  Behavior Characteristics Cooperative;Anxious  Mood Anxious  Thought Process  Coherency WDL  Content WDL  Delusions None reported or observed  Perception WDL  Hallucination None reported or observed  Judgment Impaired  Confusion None  Danger to Self  Current suicidal ideation? Denies  Agreement Not to Harm Self Yes  Description of Agreement verbal

## 2024-06-29 NOTE — Plan of Care (Signed)
   Problem: Education: Goal: Emotional status will improve Outcome: Progressing Goal: Mental status will improve Outcome: Progressing   Problem: Activity: Goal: Sleeping patterns will improve Outcome: Progressing   Problem: Coping: Goal: Ability to verbalize frustrations and anger appropriately will improve Outcome: Progressing   Problem: Safety: Goal: Periods of time without injury will increase Outcome: Progressing

## 2024-06-29 NOTE — Group Note (Signed)
 Date:  06/29/2024 Time:  9:57 AM  Group Topic/Focus: Goals group  Patients participated in a goals group focused on setting SMART goals related to recovery. The group began with an psychiatrist using a beach ball with questions to promote engagement and build rapport. Patients then reviewed the components of SMART goals and completed a goals worksheet individually. Participants were encouraged to share their personal SMART goals with the group to promote insight, accountability, and peer support.   Participation Level:  Active  Participation Quality:  Appropriate, Attentive, and Resistant  Affect:  Flat  Cognitive:  Alert and Appropriate  Insight: Appropriate and Improving  Engagement in Group:  Engaged, Improving, and Resistant  Modes of Intervention:  Discussion, Exploration, and Socialization  Additional Comments:  Murle attended and actively participated in goals group. She was resistant in sharing her goals with the group, but she did complete her goals worksheet.  Kristi HERO Bairon Klemann 06/29/2024, 9:57 AM

## 2024-06-29 NOTE — Group Note (Signed)
 Date:  06/29/2024 Time:  3:25 PM  Group Topic/Focus: Emotional Education  The group viewed a TED Talk on resilience, highlighting coping with adversity and cultivating personal strengths. The discussion provided a safe space for patients to reflect on past adverse events and explore strategies for managing challenges, focusing on controllable factors, and drawing on personal strengths and support. The group reinforced the concept that resilience is a skill that can be learned and practiced.   Participation Level:  Did Not Attend  Participation Quality:  N/A  Affect:  N/A  Cognitive:  N/A  Insight: None  Engagement in Group:  None  Modes of Intervention:  N/A  Additional Comments:  Deloise did not attend this wellness group.  Kristi HERO Rukaya Kleinschmidt 06/29/2024, 3:25 PM

## 2024-06-30 DIAGNOSIS — F411 Generalized anxiety disorder: Secondary | ICD-10-CM | POA: Diagnosis not present

## 2024-06-30 DIAGNOSIS — F429 Obsessive-compulsive disorder, unspecified: Secondary | ICD-10-CM | POA: Diagnosis not present

## 2024-06-30 DIAGNOSIS — F332 Major depressive disorder, recurrent severe without psychotic features: Secondary | ICD-10-CM | POA: Diagnosis not present

## 2024-06-30 DIAGNOSIS — T1491XA Suicide attempt, initial encounter: Secondary | ICD-10-CM | POA: Diagnosis not present

## 2024-06-30 NOTE — Progress Notes (Signed)
(  Sleep Hours) - 8.5 hours (Any PRNs that were needed, meds refused, or side effects to meds)-  Vistaril , Trazodone given (Any disturbances and when (visitation, over night)- None (Concerns raised by the patient)-  None (SI/HI/AVH)-  Denies

## 2024-06-30 NOTE — Progress Notes (Signed)
   06/30/24 2201  Psych Admission Type (Psych Patients Only)  Admission Status Voluntary  Psychosocial Assessment  Patient Complaints Anxiety  Eye Contact Fair  Facial Expression Anxious  Affect Appropriate to circumstance  Speech Logical/coherent  Interaction Minimal  Motor Activity Fidgety;Restless  Appearance/Hygiene Unremarkable  Behavior Characteristics Appropriate to situation  Mood Anxious  Thought Process  Coherency WDL  Content WDL  Delusions None reported or observed  Perception WDL  Hallucination None reported or observed  Judgment Impaired  Confusion None  Danger to Self  Current suicidal ideation? Denies  Agreement Not to Harm Self Yes  Description of Agreement verbal  Danger to Others  Danger to Others None reported or observed

## 2024-06-30 NOTE — Group Note (Signed)
 Date:  06/30/2024 Time:  4:42 PM  Group Topic/Focus:  Overcoming Stress:   The focus of this group are the benefits of gratitude on stress.    Participation Level:  Active  Participation Quality:  Appropriate  Affect:  Appropriate  Cognitive:  Appropriate  Insight: Appropriate  Engagement in Group:  Engaged  Modes of Intervention:  Discussion  Additional Comments:    Melissa Santana 06/30/2024, 4:42 PM

## 2024-06-30 NOTE — Group Note (Signed)
 Date:  06/30/2024 Time:  10:47 AM  Group Topic/Focus:  Goals Group:   The focus of this group is to help patients establish daily goals to achieve during treatment and discuss how the patient can incorporate goal setting into their daily lives to aide in recovery.    Participation Level:  Did Not Attend  Participation Quality:  n/a  Affect:  n/a  Cognitive:  n/a  Insight: None  Engagement in Group:  n/a  Modes of Intervention:  n/a  Additional Comments:  n/a  Nat Rummer 06/30/2024, 10:47 AM

## 2024-06-30 NOTE — Progress Notes (Signed)
 Upon approach Pt just coming out of bathroom, did not appear to have been vomiting, but without 1:1 really no way to tell if Pt self induces this or not.  Pt did not eat snack per MHT, requested water with medication. Encouraged fluids and nutrition.

## 2024-06-30 NOTE — Plan of Care (Signed)
   Problem: Education: Goal: Emotional status will improve Outcome: Progressing Goal: Mental status will improve Outcome: Progressing Goal: Verbalization of understanding the information provided will improve Outcome: Progressing

## 2024-06-30 NOTE — Plan of Care (Signed)
   Problem: Education: Goal: Verbalization of understanding the information provided will improve Outcome: Progressing   Problem: Activity: Goal: Interest or engagement in activities will improve Outcome: Progressing

## 2024-06-30 NOTE — Progress Notes (Addendum)
 [facility] Inpatient Psychiatry Progress Note  Date: 06/30/24 Patient: Melissa Santana MRN: [id number]   Diagnosis Principal Problem:   MDD (major depressive disorder), recurrent severe, without psychosis (HCC) Active Problems:   Generalized anxiety disorder   Suicide attempt (HCC)   OCD (obsessive compulsive disorder) Rule out: restrictive/purging disorder    Assessment and Plan: Melissa Santana is a 23 year old female who presented to our service for suicide attempt by bilateral wrist lacerations in the context of uncontrolled major depression, severe anxiety about her health and obsessive/compulsive thoughts related to her health.  Mood has been deteriorating over the last several months characterized by anhedonia, severe anxiety and OCD related to GI distress.  She has lost 20 pounds over the last several weeks.  This culminated with a suicide attempt.  She has previous trials of Lexapro  with reported good benefit though it has been 1 to 3 years since last medication trials.  She has no history of mania or psychosis, and there is no family history of bipolar disorder.  I discussed treating the patient for MDD, GAD, OCD.  We will restart Lexapro  and supplement with olanzapine (for severe anxiety, mood stabilization, impaired appetite and impaired sleep ).    11/8 - Vital signs stable, labs unremarkable.  TSH returned 1.1.  Lipids and A1c were normal as well I discussed importance of adequate nourishment with the patient today and ordered additional ensures twice daily as meal supplement.  Patient continues to report ongoing GI distress thus initiated proton pump inhibitor.  Will increase Lexapro  and olanzapine given ongoing severe symptoms of MDD, GAD and OCD.  11/9 - Vital signs stable.  No new labs.  I ordered strict I's and O's and monitor for self-induced emesis.  There are no reported I/O values in the chart. - Mood and affect are minimally improved.   Continues to have depression and anxiety. - My concern is the patient is minimizing the severity of her mental health problems and minimizing her reported eating patterns   # MDD (major depressive disorder), recurrent severe, without psychosis (HCC), GAD, OCD - Continue Lexapro  to 10 mg MDD, GAD and OCD - Continue olanzapine to 5 mg nightly for severe anxiety, ruminations and unstable mood  # Low BMI, 15.9 - Ordered Strict I&O and monitor for self induced emesis - Ensures, meal supplement twice daily - Lock out room for 30 minutes after meals to prevent purging - TSH 1.1 - Hemoglobin A1c 4.9 - Lipid panel within normal limits - CMP within normal limits - EKG on 06/27/2024: Rate 90, QTc 465, normal sinus rhythm   # GI distress - Continue Protonix  20 mg - Maalox - Milk of magnesia  Risk Assessment -mild  Discharge Planning Barriers to discharge: Mood and eating Estimated length of stay: 7 days Predicted Discharge location: Return home with family     Interval History and update:   -Chart reviewed -Vital signs stable -Ins and outs were not reported by nursing On my assessment today, the patient is Melissa Santana, less anxious and states I feel little better today.  We started her on a proton pump inhibitor yesterday and the patient reports being able to eat better and does not have any abdominal pain, nausea or vomiting.  In terms of her mood she feels her depression and anxiety are slightly improved though still remain.  Denies suicidal thoughts.  No auditory or visual hallucinations were observed. - Tolerating her medication regimen well with no observed reported side effects. - Sleep  is improved - I discussed the patient's eating patterns and she denies purging and denies thoughts that she is overweight.  The patient states that she does not restrict her eating due to believing that she is overweight and denies any history of purging.  She does state that she will restrict her  eating due to concern of nausea, vomiting and diarrhea.  She informs me that she has not been diagnosed with an eating disorder and reports enjoying food but is afraid of what it will do to her.    Review of systems  -Denies denies nausea, abdominal pain, vomiting, diarrhea, constipation, chest pain, shortness of breath, headache, abnormal movements     Physical Exam MSK/Neuro - Normal gait and station    Mental Status Exam Appearance -disheveled, very thin/malnourished, lacerations bilaterally on the wrist and hematoma on the forehead Attitude - Calm, polite, not guarded Speech - normal volume, prosody, inflection Mood - I am fine Affect -restricted Thought Process - LLGD Thought Content - No delusional thoughts expressed SI/HI - Denies  Perceptions - Denies AVH; not RIS Judgement/Insight -adequate Fund of knowledge - WNL Language - No impairments      Lab Results:  Admission on 06/27/2024  Component Date Value Ref Range Status   TSH 06/28/2024 1.130  0.350 - 4.500 uIU/mL Final   Cholesterol 06/28/2024 138  0 - 200 mg/dL Final   Triglycerides 88/92/7974 57  <150 mg/dL Final   HDL 88/92/7974 57  >40 mg/dL Final   Total CHOL/HDL Ratio 06/28/2024 2.4  RATIO Final   VLDL 06/28/2024 11  0 - 40 mg/dL Final   LDL Cholesterol 06/28/2024 70  0 - 99 mg/dL Final   Hgb J8r MFr Bld 06/28/2024 4.9  4.8 - 5.6 % Final   Mean Plasma Glucose 06/28/2024 93.93  mg/dL Final     Vitals: Blood pressure 102/82, pulse 90, temperature 98.1 F (36.7 C), temperature source Oral, resp. rate 18, height 5' 7 (1.702 m), weight 46.3 kg, last menstrual period 06/13/2024, SpO2 99%.    Lamar Handler Jama Slain, DO

## 2024-06-30 NOTE — Progress Notes (Signed)
   06/30/24 1300  Psych Admission Type (Psych Patients Only)  Admission Status Voluntary  Psychosocial Assessment  Patient Complaints Anxiety  Eye Contact Intense  Facial Expression Anxious  Affect Anxious  Speech Logical/coherent;Soft  Interaction Minimal  Motor Activity Fidgety;Restless  Appearance/Hygiene Unremarkable  Behavior Characteristics Appropriate to situation  Mood Anxious;Depressed  Thought Process  Coherency WDL  Content WDL  Delusions None reported or observed  Perception WDL  Hallucination None reported or observed  Judgment Impaired  Confusion None  Danger to Self  Current suicidal ideation? Denies  Description of Suicide Plan No Plan  Agreement Not to Harm Self Yes  Description of Agreement Verbal  Danger to Others  Danger to Others None reported or observed

## 2024-07-01 NOTE — Group Note (Signed)
 Date:  07/01/2024 Time:  10:30 AM  Group Topic/Focus:  Emotional Goals and Wellness orientation  Goals Group:   The focus of this group is to help patients establish daily goals to achieve during treatment and discuss how the patient can incorporate goal setting into their daily lives to aide in recovery. Orientation:   The focus of this group is to educate the patient on the purpose and policies of crisis stabilization and provide a format to answer questions about their admission.  The group details unit policies and expectations of patients while admitted.    Participation Level:  Active  Participation Quality:  Appropriate  Affect:  Appropriate  Cognitive:  Alert and Appropriate  Insight: Appropriate  Engagement in Group:  Engaged  Modes of Intervention:  Discussion and Orientation  Additional Comments:    Melissa Santana 07/01/2024, 10:30 AM

## 2024-07-01 NOTE — Plan of Care (Signed)
   Problem: Education: Goal: Knowledge of risk factors and measures for prevention of condition will improve Outcome: Progressing   Problem: Coping: Goal: Psychosocial and spiritual needs will be supported Outcome: Progressing

## 2024-07-01 NOTE — Group Note (Signed)
 Recreation Therapy Group Note   Group Topic:Communication  Group Date: 07/01/2024 Start Time: 0935 End Time: 1000 Facilitators: Keiry Kowal-McCall, LRT,CTRS Location: [address] Dayroom   Group Topic: Communication, Team Building, Problem Solving  Goal Area(s) Addresses:  Patient will effectively work with peer towards shared goal.  Patient will identify skills used to make activity successful.  Patient will identify how skills used during activity can be applied to reach post d/c goals.   Behavioral Response: Engaged  Intervention: STEM Activity- Glass Blower/designer  Activity: Tallest [employer]. In teams of 5-6, patients were given 11 craft pipe cleaners. Using the materials provided, patients were instructed to compete again the opposing team(s) to build the tallest free-standing structure from floor level. The activity was timed; difficulty increased by clinical research associate as production designer, theatre/television/film continued.  Systematically resources were removed with additional directions for example, placing one arm behind their back, working in silence, and shape stipulations. LRT facilitated post-activity discussion reviewing team processes and necessary communication skills involved in completion. Patients were encouraged to reflect how the skills utilized, or not utilized, in this activity can be incorporated to positively impact support systems post discharge.  Education: Pharmacist, Community, Scientist, Physiological, Discharge Planning   Education Outcome: Acknowledges education/In group clarification offered/Needs additional education.    Affect/Mood: Appropriate   Participation Level: Engaged   Participation Quality: Independent   Behavior: Appropriate   Speech/Thought Process: Focused   Insight: Good   Judgement: Good   Modes of Intervention: STEM Activity   Patient Response to Interventions:  Engaged   Education Outcome:  In group clarification offered    Clinical  Observations/Individualized Feedback: Pt was bright and active during group session. Pt was focused and helped create her groups tower.     Plan: Continue to engage patient in RT group sessions 2-3x/week.   Aziah Kaiser-McCall, LRT,CTRS 07/01/2024 11:36 AM

## 2024-07-01 NOTE — Group Note (Signed)
 Occupational Therapy Group Note  Group Topic:Coping Skills  Group Date: 07/01/2024 Start Time: 1500 End Time: 1538 Facilitators: Dot [place] MATSU, OT   Group Description: Group encouraged increased engagement and participation through discussion and activity focused on Coping Ahead. Patients were split up into teams and selected a card from a stack of positive coping strategies. Patients were instructed to act out/charade the coping skill for other peers to guess and receive points for their team. Discussion followed with a focus on identifying additional positive coping strategies and patients shared how they were going to cope ahead over the weekend while continuing hospitalization stay.  Therapeutic Goal(s): Identify positive vs negative coping strategies. Identify coping skills to be used during hospitalization vs coping skills outside of hospital/at home Increase participation in therapeutic group environment and promote engagement in treatment   Participation Level: Engaged   Participation Quality: Independent   Behavior: Appropriate   Speech/Thought Process: Relevant   Affect/Mood: Appropriate   Insight: Fair   Judgement: Fair      Modes of Intervention: Education  Patient Response to Interventions:  Attentive   Plan: Continue to engage patient in OT groups 2 - 3x/week.  07/01/2024  [place] MATSU Dot, OT  Melissa Santana, OT

## 2024-07-01 NOTE — Group Note (Signed)
 Date:  07/01/2024 Time:  10:58 AM  Group Topic/Focus: Recreational Therapy  Participation Level:  Active  Additional Comments:  Patient attended  Melissa Santana 07/01/2024, 10:58 AM

## 2024-07-01 NOTE — Plan of Care (Signed)
   Problem: Education: Goal: Verbalization of understanding the information provided will improve Outcome: Progressing   Problem: Activity: Goal: Interest or engagement in activities will improve Outcome: Progressing

## 2024-07-01 NOTE — Group Note (Signed)
 Date:  07/01/2024 Time:  11:31 AM  Group Topic/Focus: Emotional Wellness  is a key aspect of overall mental health, and it involves the ability to effectively manage, understand, and express emotions in a way that enhances life satisfaction and promotes well-being. For a mental health group, emotional wellness is a vital focus because it directly influences how individuals cope with challenges, handle relationships, and navigate daily stressors. Achieving emotional wellness helps group members build resilience, improve communication, and foster a sense of connectedness and self-worth.      Participation Level:  Did Not Attend  Additional Comments:    Melissa Santana 07/01/2024, 11:31 AM

## 2024-07-01 NOTE — Progress Notes (Signed)
(  Sleep Hours) -  8.25 hours (Any PRNs that were needed, meds refused, or side effects to meds)-  Vistaril, Trazodone given (Any disturbances and when (visitation, over night)- None (Concerns raised by the patient)- None (SI/HI/AVH)-  Denies

## 2024-07-01 NOTE — Group Note (Deleted)
 Date:  07/01/2024 Time:  9:59 AM  Group Topic/Focus:  Goals Group:   The focus of this group is to help patients establish daily goals to achieve during treatment and discuss how the patient can incorporate goal setting into their daily lives to aide in recovery. Orientation:   The focus of this group is to educate the patient on the purpose and policies of crisis stabilization and provide a format to answer questions about their admission.  The group details unit policies and expectations of patients while admitted.     Participation Level:  {BHH PARTICIPATION OZCZO:77735}  Participation Quality:  {B[facility] PARTICIPATION QUALITY:22265}  Affect:  {B[facility] AFFECT:22266}  Cognitive:  {B[facility] COGNITIVE:22267}  Insight: {BHH Insight2:20797}  Engagement in Group:  {B[facility] ENGAGEMENT IN HMNLE:77731}  Modes of Intervention:  {BHH MODES OF INTERVENTION:22269}  Additional Comments:  ***  Melissa Santana 07/01/2024, 9:59 AM

## 2024-07-01 NOTE — Progress Notes (Addendum)
 Pt ate a yogurt for snack tonight, small amount of water.  Pt states that she id not having difficulty with urination or with bowel movements.  Went to check for output, no hat in Pt's toilet.  This staff member put one in the room, Pt asleep, will have to be explained to her in AM.  Pt does have roommate.

## 2024-07-01 NOTE — Group Note (Signed)
 Date:  07/01/2024 Time:  4:23 PM  Group Topic/Focus: occupational therapy  Occupational therapy in mental health focuses on helping clients develop skills, habits, and routines that support emotional well-being, social participation, and daily functioning. Group therapy provides a supportive environment for peer learning, connection, and practice of coping skills.      Participation Level:  Active    Additional Comments:  Patient attended  Melissa Santana 07/01/2024, 4:23 PM

## 2024-07-01 NOTE — Progress Notes (Signed)
 Melissa Santana  Date: 07/01/24 Patient: Melissa Santana MRN: [id number]   Diagnosis Principal Problem:   MDD (major depressive disorder), recurrent severe, without psychosis (HCC) Active Problems:   Generalized anxiety disorder   Suicide attempt (HCC)   OCD (obsessive compulsive disorder) Rule out: restrictive/purging disorder    Assessment and Plan: Melissa Santana is a 23 year old female who presented to our service for suicide attempt by bilateral wrist lacerations in the context of uncontrolled major depression, severe anxiety about her health and obsessive/compulsive thoughts related to her health.  Mood has been deteriorating over the last several months characterized by anhedonia, severe anxiety and OCD related to GI distress.  She has lost 20 pounds over the last several weeks.  This culminated with a suicide attempt.  She has previous trials of Lexapro  with reported good benefit though it has been 1 to 3 years since last medication trials.  She has no history of mania or psychosis, and there is no family history of bipolar disorder.  I discussed treating the patient for MDD, GAD, OCD.  We will restart Lexapro  and supplement with olanzapine (for severe anxiety, mood stabilization, impaired appetite and impaired sleep ).     11/10 Vital signs stable.  No new labs. Patient appears cooperative during the encounter, with a flat affect and some intrusive behaviors. She reports improvement in her anxiety and depressive symptoms and is tolerating her recent Lexapro  dose increase without complaints. She remains on strict intake and output monitoring, with room lockout for 30 minutes following meals to monitor for self-induced emesis. Appetite, sleep, energy, and concentration are stable or improving. No suicidal or homicidal ideation, psychotic symptoms, or significant medical complaints are present. Patient questionably minimizing the  severity of her mental health symptoms. Continues current psychiatric treatment regimen with no changes indicated at this time.   # MDD (major depressive disorder), recurrent severe, without psychosis (HCC), GAD, OCD - Continue Lexapro  10 mg MDD, GAD and OCD - Continue olanzapine  5 mg nightly for severe anxiety, ruminations and unstable mood  # Low BMI, 15.9 - Ordered Strict I&O and monitor for self induced emesis - Ensures, meal supplement twice daily - Lock out room for 30 minutes after meals to prevent purging - TSH 1.1 - Hemoglobin A1c 4.9 - Lipid panel within normal limits - CMP within normal limits - EKG on 06/27/2024: Rate 90, QTc 465, normal sinus rhythm   # GI distress - Continue Protonix  20 mg - Maalox - Milk of magnesia  Risk Assessment -mild  Discharge Planning Barriers to discharge: Mood and eating Estimated length of stay: 7 days Predicted Discharge location: Return home with family     Interval History and update:   -Chart reviewed -Vital signs stable -Ins and outs were not reported by nursing  The patient was seen in her room during rounds today, wearing a casual top and scrub pants, drinking an Ensure. She verbalized understanding when reminded to report all fluid intake and output to staff. She reports that her mood is "pretty good" and that she is motivated to return home to her parents. She attributes her hospitalization primarily to anxiety and wanting to restart her medications. She rates her current anxiety as 2/10 and depression as 1/10. The patient reports that her appetite is good; noting she consumed approximately 75% of her breakfast (eggs, oatmeal, and milk) and about 75% of her dinner (hamburger macaroni). She states her sleep has been good without difficulties. She denies  suicidal ideation, passive thoughts of death, or urges to self-harm. She denies psychotic symptoms. She reports her energy is improving, and that her concentration has improved  slightly. She denies side effects from her current psychiatric medications and reports no abdominal pain since starting Protonix , which she states has helped. She notes that her parents visited yesterday evening and the visit went well. She expresses that her current goal is to work on returning home.   Review of systems  -Denies denies nausea, abdominal pain, vomiting, diarrhea, constipation, chest pain, shortness of breath, headache, abnormal movements     Physical Exam MSK/Neuro - Normal gait and station    Mental Status Exam Appearance -disheveled, very thin/malnourished, lacerations bilaterally on the wrist and hematoma on the forehead Attitude - Calm, polite, not guarded Speech - normal volume, prosody, inflection Mood - Pretty good Affect -restricted Thought Process - LLGD Thought Content - No delusional thoughts expressed SI/HI - Denies  Perceptions - Denies AVH; not RIS Judgement/Insight -adequate Fund of knowledge - WNL Language - No impairments      Lab Results:  Admission on 06/27/2024  Component Date Value Ref Range Status   TSH 06/28/2024 1.130  0.350 - 4.500 uIU/mL Final   Cholesterol 06/28/2024 138  0 - 200 mg/dL Final   Triglycerides 88/92/7974 57  <150 mg/dL Final   HDL 88/92/7974 57  >40 mg/dL Final   Total CHOL/HDL Ratio 06/28/2024 2.4  RATIO Final   VLDL 06/28/2024 11  0 - 40 mg/dL Final   LDL Cholesterol 06/28/2024 70  0 - 99 mg/dL Final   Hgb J8r MFr Bld 06/28/2024 4.9  4.8 - 5.6 % Final   Mean Plasma Glucose 06/28/2024 93.93  mg/dL Final     Vitals: Blood pressure 119/78, pulse (!) 112, temperature 98.4 F (36.9 C), temperature source Oral, resp. rate 18, height 5' 7 (1.702 m), weight 46.3 kg, last menstrual period 06/13/2024, SpO2 98%.    Melissa Chiquita Hint, Melissa Santana  Patient ID: Melissa Santana, female   DOB: 06/10/2001, 23 y.o.   MRN: [id number]

## 2024-07-01 NOTE — [facility] Group Notes (Signed)
 [facility] Group Notes:  (Nursing/MHT/Case Management/Adjunct)  Date:  07/01/2024  Time:  12:43 AM  Type of Therapy:  Group Therapy  Participation Level:  Active  Participation Quality:  Appropriate  Affect:  Appropriate  Cognitive:  Appropriate  Insight:  Appropriate  Engagement in Group:  Improving  Modes of Intervention:  Socialization and Support  Summary of Progress/Problems:Pt attended group.    Optional questions asked: How do you feel about the idea of forgiving yourself for the past mistakes or difficult times?,  what is something you wish others understood about you ?, and Using the first  3 letters in your name, describe yourself.   Pt went over in group with other peers about Forgiveness and Self-Compassion, Understanding and Connection.   Melissa Santana 07/01/2024, 12:43 AM

## 2024-07-01 NOTE — Group Note (Signed)
 Date:  07/01/2024 Time:  3:30 PM  Group Topic/Focus: [place]in Spirituality:   The focus of this group is to discuss how one's spirituality can aide in recovery.    Participation Level:  Active     Additional Comments:  Patient attended  Melissa Santana 07/01/2024, 3:30 PM

## 2024-07-02 MED ORDER — OLANZAPINE 5 MG PO TABS
5.0000 mg | ORAL_TABLET | Freq: Every day | ORAL | Status: DC
  Administered 2024-07-03: 5 mg via ORAL
  Filled 2024-07-02: qty 1
  Filled 2024-07-02: qty 10

## 2024-07-02 MED ORDER — ESCITALOPRAM OXALATE 10 MG PO TABS
10.0000 mg | ORAL_TABLET | Freq: Every day | ORAL | Status: DC
  Administered 2024-07-03: 10 mg via ORAL
  Filled 2024-07-02: qty 1
  Filled 2024-07-02: qty 10

## 2024-07-02 NOTE — Group Note (Signed)
 LCSW Group Therapy Note   Group Date: 07/02/2024 Start Time: 1100 End Time: 1200   Participation:  Patient was present.  Participation status:  active.  Type of Therapy:  Group Therapy   Topic:  Understanding Your Path to Change  Objective:  The goal is to help individuals understand the stages of change, identify where they currently are in the process, and provide actionable next steps to continue moving forward in their journey of change.  Goals: - Learn about the six stages of change:  Precontemplation, Contemplation, Preparation, Action, Maintenance, and Relapse - Reflect on Current Change Efforts:  Recognize which stage participants are in regarding a personal change. - Plan Next Steps for Moving Forward:  Create an action plan based on their current stage of change.  Summary:  In this session, we explored the Stages of Change as a framework to understand the process of change.  We discussed how each stage helps individuals recognize where they are in their personal journey and used the Stages of Change Worksheet for self-reflection. Participants answered questions to better understand their current stage, challenges, and progress. We also emphasized the importance of moving forward, even if setbacks (Relapse) occur, and created actionable steps to help participants continue progressing. By the end of the session, participants gained a clearer understanding of their path to change and left with a clear plan for next steps.  Modalities:  Elements of CBT (cognitive restructuring, problem solving)  Element of DBT (mindfulness, distress tolerance)   Julie Nay O Enio Hornback, LCSWA 07/02/2024  12:32 PM

## 2024-07-02 NOTE — [facility] Group Notes (Signed)
 [facility] Group Notes:  (Nursing/MHT/Case Management/Adjunct)  Date:  07/02/2024  Time:  9:48 PM  Type of Therapy:  Wrap-up group  Participation Level:  Active  Participation Quality:  Appropriate  Affect:  Appropriate  Cognitive:  Appropriate  Insight:  Appropriate  Engagement in Group:  Engaged  Modes of Intervention:  Educational  Summary of Progress/Problems: Goal go to groups, met. Rated day 5/10.  Melissa Santana 07/02/2024, 9:48 PM

## 2024-07-02 NOTE — Group Note (Signed)
 Recreation Therapy Group Note   Group Topic:Animal Assisted Therapy   Group Date: 07/02/2024 Start Time: 0950 End Time: 1030 Facilitators: Denton Derks-McCall, LRT,CTRS Location: [address] Dayroom   Animal-Assisted Activity (AAA) Program Checklist/Progress Notes Patient Eligibility Criteria Checklist & Daily Group note for Rec Tx Intervention  AAA/T Program Assumption of Risk Form signed by Patient/ or Parent Legal Guardian Yes  Patient is free of allergies or severe asthma Yes  Patient reports no fear of animals Yes  Patient reports no history of cruelty to animals Yes  Patient understands his/her participation is voluntary Yes  Patient washes hands before animal contact Yes  Patient washes hands after animal contact Yes  Behavioral Response:    Education: Hand Washing, Appropriate Animal Interaction   Education Outcome: Acknowledges education.    Affect/Mood: Appropriate   Participation Level: Active   Participation Quality: Independent   Behavior: Appropriate   Speech/Thought Process: Focused   Insight: Good   Judgement: Good   Modes of Intervention: Teaching Laboratory Technician   Patient Response to Interventions:  Engaged   Education Outcome:  In group clarification offered    Clinical Observations/Individualized Feedback: Patient attended session and interacted appropriately with therapy dog and peers. Patient asked appropriate questions about therapy dog and his training.      Plan: Continue to engage patient in RT group sessions 2-3x/week.   Darcus Edds-McCall, LRT,CTRS 07/02/2024 1:07 PM

## 2024-07-02 NOTE — Group Note (Signed)
 Date:  07/02/2024 Time:  4:44 PM  Group Topic/Focus:  Making Healthy Choices:   The focus of this group is to help patients identify negative/unhealthy choices they were using prior to admission and identify positive/healthier coping strategies to replace them upon discharge. Self Care:   The focus of this group is to help patients understand the importance of self-care in order to improve or restore emotional, physical, spiritual, interpersonal, and financial health.    Participation Level:  Active  Participation Quality:  Appropriate  Affect:  Appropriate  Cognitive:  Appropriate  Insight: Appropriate  Engagement in Group:  Engaged  Modes of Intervention:  Discussion and Education  Additional Comments:    Juliene CHRISTELLA Huddle 07/02/2024, 4:44 PM

## 2024-07-02 NOTE — Plan of Care (Signed)
   Problem: Education: Goal: Knowledge of risk factors and measures for prevention of condition will improve Outcome: Progressing   Problem: Coping: Goal: Psychosocial and spiritual needs will be supported Outcome: Progressing

## 2024-07-02 NOTE — Group Note (Signed)
 Date:  07/02/2024 Time:  9:41 AM  Group Topic/Focus: Goals Group  Patients participated in a goals-focused group aimed at increasing self-awareness, motivation, and future planning. As an research scientist (life sciences), patients were provided a worksheet listing 50 positive traits and asked to identify several that described themselves to promote positive self-concept. Patients then completed a goal-setting worksheet outlining short- and long-term goals (1 week, 1 month, 1 year, and 5 years). Group discussion centered on sharing individual goals, identifying potential obstacles, and exploring coping strategies and problem-solving methods to overcome barriers. Patients were encouraged to use realistic, measurable goals and to practice self-compassion in the goal-setting process.   Participation Level:  Active  Participation Quality:  Appropriate, Attentive, and Sharing  Affect:  Flat  Cognitive:  Alert and Appropriate  Insight: Good and Improving  Engagement in Group:  Engaged and Improving  Modes of Intervention:  Discussion, Exploration, and Socialization  Additional Comments:  Melissa Santana attended and actively participated in goals group.  Melissa Santana 07/02/2024, 9:41 AM

## 2024-07-02 NOTE — [facility] Group Notes (Signed)
 Melissa Santana attended the social work group today 07/02/24 (1100-1150).

## 2024-07-02 NOTE — BH Assessment (Signed)
(  Sleep Hours) - (Any PRNs that were needed, meds refused, or side effects to meds)-  (Any disturbances and when (visitation, over night)- None (Concerns raised by the patient)- None (SI/HI/AVH)- Denies

## 2024-07-02 NOTE — Plan of Care (Signed)
   Problem: Education: Goal: Knowledge of risk factors and measures for prevention of condition will improve Outcome: Progressing

## 2024-07-02 NOTE — Progress Notes (Signed)
 [facility]patient Psychiatry Progress Note  Date: 07/02/24 Patient: Melissa Santana MRN: [id number]   Diagnosis Principal Problem:   MDD (major depressive disorder), recurrent severe, without psychosis (HCC) Active Problems:   Generalized anxiety disorder   Suicide attempt (HCC)   OCD (obsessive compulsive disorder) Rule out: restrictive/purging disorder    Assessment and Plan: Melissa Santana is a 23 year old female who presented to our service for suicide attempt by bilateral wrist lacerations in the context of uncontrolled major depression, severe anxiety about her health and obsessive/compulsive thoughts related to her health.  Mood has been deteriorating over the last several months characterized by anhedonia, severe anxiety and OCD related to GI distress.  She has lost 20 pounds over the last several weeks.  This culminated with a suicide attempt.  She has previous trials of Lexapro  with reported good benefit though it has been 1 to 3 years since last medication trials.  She has no history of mania or psychosis, and there is no family history of bipolar disorder.  I discussed treating the patient for MDD, GAD, OCD.  We will restart Lexapro  and supplement with olanzapine (for severe anxiety, mood stabilization, impaired appetite and impaired sleep ).     11/10: Vital signs stable.  No new labs. Patient appears cooperative during the encounter, with a flat affect and some intrusive behaviors. She reports improvement in her anxiety and depressive symptoms and is tolerating her recent Lexapro  dose increase without complaints. She remains on strict intake and output monitoring, with room lockout for 30 minutes following meals to monitor for self-induced emesis. Appetite, sleep, energy, and concentration are stable or improving. No suicidal or homicidal ideation, psychotic symptoms, or significant medical complaints are present. Patient questionably minimizing the  severity of her mental health symptoms. Continues current psychiatric treatment regimen with no changes indicated at this time.  11/11:  Vital signs stable.  No new labs. Patient appears with improving mood, appetite, and concentration. Mild daytime drowsiness and flat affect may be medication-related, and the timing adjustment of Lexapro  and Zyprexa is appropriate. She denies suicidal or homicidal ideation or psychotic symptoms. She remains cooperative, visible on the unit, though social interaction with peers remains limited. Continued observation and coordination with her parents regarding discharge planning are indicated. The plan remains for discharge home under parental care once clinically appropriate.   Collateral Information Oscar - Mother [phone]): With the patient's consent, contact was made with her mother, Melissa Santana. She reported that she and the patient's father last visited on Sunday evening and brought the patient some clothes. Melissa Santana stated that she spoke with the patient by phone yesterday, and the patient's father spoke with her today. She described the patient as sounding "OK given the circumstances," noting that she sounded better than she did during their first visit on Friday evening.  The patient's mother expressed that while she wants her daughter to return home, her primary concern is ensuring the patient's safety and stability. She shared that the patient's primary care provider had planned to refer her to a therapist and a nutritionist. She reports initial concerns centered around sleep difficulties. Melissa Santana stated that she has noticed improvement in the patient's tone and affect, commenting that she "she has pepped up a little bit." She confirmed that the patient has previously taken Lexapro  with benefit and understands that the medication may take some time to reach full effect.  Melissa Santana confirmed that the patient lives at home with both parents and is currently unemployed  due to anxiety, describing that the patient "was panicky all the time." She expressed that she and the patient's father wish to be involved in discharge planning to ensure appropriate support and structure once the patient returns home. As both parents work, the plan is for the patient's grandparents to stay with her during the day to ensure she is not alone. The patient's mother was appreciative of the update and expressed gratitude for the call.   # MDD (major depressive disorder), recurrent severe, without psychosis (HCC), GAD, OCD - Modify Lexapro  10 mg from daily to nightly, MDD, GAD and OCD - Modify olanzapine 5 mg from daily to nightly for severe anxiety, ruminations and unstable mood  # Low BMI, 15.9 - Ordered Strict I&O and monitor for self induced emesis - Ensures, meal supplement twice daily - Lock out room for 30 minutes after meals to prevent purging - TSH 1.1 - Hemoglobin A1c 4.9 - Lipid panel within normal limits - CMP within normal limits - EKG on 06/27/2024: Rate 90, QTc 465, normal sinus rhythm   # GI distress - Continue Protonix  20 mg - Maalox - Milk of magnesia  Risk Assessment -mild  Discharge Planning Barriers to discharge: Mood and eating Estimated length of stay: 7 days Predicted Discharge location: Return home with family     Interval History and update:   -Chart reviewed -Vital signs stable -Ins and outs were not reported by nursing  The patient was observed attending unit groups and activities today. She reports her mood as "good" and rates both her anxiety and depression as 1/10. She notes her appetite is improving, eating all of her breakfast (eggs, oatmeal, bacon, and applesauce) and lunch (vegetarian pasta). She reports mild difficulty falling asleep last night but was able to stay asleep once she fell asleep. She describes her energy level as "pretty OK" and states her concentration is getting better. She denies suicidal ideation, passive thoughts  of death, self-harm urges, homicidal ideation, or psychotic symptoms. She reports mild daytime drowsiness and agrees to move her Lexapro  and Zyprexa dosing from morning to bedtime to address this. She denies any physical or gastrointestinal GI complaints and states she is tolerating her nutritional supplements between meals well. States she continues to notify nursing staff when she eats, drinks, or uses the bathroom so intake and output can be monitored. The patient expresses motivation for ongoing treatment after discharge, including connecting with a therapist and psychiatric provider.   Review of systems  -Denies denies nausea, abdominal pain, vomiting, diarrhea, constipation, chest pain, shortness of breath, headache, abnormal movements     Physical Exam MSK/Neuro - Normal gait and station    Mental Status Exam Appearance -disheveled, very thin/malnourished, lacerations bilaterally on the wrist and hematoma on the forehead Attitude - Calm, polite, not guarded Speech - normal volume Mood - Good Affect -restricted Thought Process - LLGD Thought Content - No delusional thoughts expressed SI/HI - Denies  Perceptions - Denies AVH; not RIS Judgement/Insight -adequate Fund of knowledge - WNL Language - No impairments      Lab Results:  Admission on 06/27/2024  Component Date Value Ref Range Status   TSH 06/28/2024 1.130  0.350 - 4.500 uIU/mL Final   Cholesterol 06/28/2024 138  0 - 200 mg/dL Final   Triglycerides 88/92/7974 57  <150 mg/dL Final   HDL 88/92/7974 57  >40 mg/dL Final   Total CHOL/HDL Ratio 06/28/2024 2.4  RATIO Final   VLDL 06/28/2024 11  0 - 40 mg/dL Final  LDL Cholesterol 06/28/2024 70  0 - 99 mg/dL Final   Hgb J8r MFr Bld 06/28/2024 4.9  4.8 - 5.6 % Final   Mean Plasma Glucose 06/28/2024 93.93  mg/dL Final     Vitals: Blood pressure 115/77, pulse 88, temperature 98.1 F (36.7 C), temperature source Oral, resp. rate 18, height 5' 7 (1.702 m), weight 46.3  kg, last menstrual period 06/13/2024, SpO2 100%.    Blair Chiquita Hint, NP  Patient ID: Melissa Santana, female   DOB: 09-Aug-2001, 23 y.o.   MRN: [id number] Patient ID: KIERSTAN AUER, female   DOB: Jun 08, 2001, 23 y.o.   MRN: [id number]

## 2024-07-02 NOTE — Progress Notes (Addendum)
 CSW provided Medicaid application for patient and explored treatment options. Patient is interested in PHP or IOP.   SIGNED: Mellonie Guess Nunez-Uva, LCSW-A

## 2024-07-02 NOTE — [facility] Group Notes (Signed)
 Melissa Santana attended the pet therapy group today 07/02/24 [phone]).

## 2024-07-03 ENCOUNTER — Encounter (HOSPITAL_COMMUNITY): Payer: Self-pay

## 2024-07-03 NOTE — [facility] Group Notes (Signed)
 [facility] Group Notes:  (Nursing/MHT/Case Management/Adjunct)  Date:  07/03/2024  Time:  9:11 PM  Type of Therapy:  Wrap-up group  Participation Level:  Active  Participation Quality:  Appropriate  Affect:  Appropriate  Cognitive:  Appropriate  Insight:  Appropriate  Engagement in Group:  Engaged  Modes of Intervention:  Education  Summary of Progress/Problems:Attended NA meeting.  Grayce LITTIE Essex 07/03/2024, 9:11 PM

## 2024-07-03 NOTE — Group Note (Signed)
 Recreation Therapy Group Note   Group Topic:Leisure Education  Group Date: 07/03/2024 Start Time: 0933 End Time: 1004 Facilitators: Kyl Givler-McCall, LRT,CTRS Location: [address] Dayroom   Group Topic: Leisure Education    Goal Area(s) Addresses:  Patient will successfully identify benefits of leisure participation. Patient will successfully identify ways to access leisure activities. Patient will identify what leisure is.  Behavioral Response: Active   Intervention: Poster Making   Activity: Patients and LRT discussed what leisure was and it's purpose. Patients had to option of working alone or with a partner to create a poster about leisure in general or a specific activity. In that poster, patients were to identify the benefits, types of leisure and importance of it. Patients shared their posters with peers during processing. Patients were debriefed on being active in leisure, and also how leisure can still provide you with lessons to help you learn.   Education:  Leisure Programme Researcher, Broadcasting/film/video, Building Control Surveyor   Education Outcome: Acknowledges education   Affect/Mood: Flat   Participation Level: Active   Participation Quality: Independent   Behavior: Appropriate   Speech/Thought Process: Relevant   Insight: Moderate   Judgement: Moderate   Modes of Intervention: Art   Patient Response to Interventions:  Receptive   Education Outcome:  In group clarification offered    Clinical Observations/Individualized Feedback: Pt was quiet during group and attentive. Pt created a poster that emphasized going to the beach as leisure.     Plan: Continue to engage patient in RT group sessions 2-3x/week.   Anjelika Ausburn-McCall, LRT,CTRS 07/03/2024 12:40 PM

## 2024-07-03 NOTE — Progress Notes (Addendum)
 [facility] Inpatient Psychiatry Progress Note  Date: 07/03/24 Patient: Melissa Santana MRN: [id number]   Diagnosis Principal Problem:   MDD (major depressive disorder), recurrent severe, without psychosis (HCC) Active Problems:   Generalized anxiety disorder   Suicide attempt (HCC)   OCD (obsessive compulsive disorder) Rule out: restrictive/purging disorder    Assessment and Plan: Melissa Santana is a 23 year old female who presented to our service for suicide attempt by bilateral wrist lacerations in the context of uncontrolled major depression, severe anxiety about her health and obsessive/compulsive thoughts related to her health.  Mood has been deteriorating over the last several months characterized by anhedonia, severe anxiety and OCD related to GI distress.  She has lost 20 pounds over the last several weeks.  This culminated with a suicide attempt.  She has previous trials of Lexapro  with reported good benefit though it has been 1 to 3 years since last medication trials.  She has no history of mania or psychosis, and there is no family history of bipolar disorder.  I discussed treating the patient for MDD, GAD, OCD.  We will restart Lexapro  and supplement with olanzapine (for severe anxiety, mood stabilization, impaired appetite and impaired sleep ).     11/10: Vital signs stable.  No new labs. Patient appears cooperative during the encounter, with a flat affect and some intrusive behaviors. She reports improvement in her anxiety and depressive symptoms and is tolerating her recent Lexapro  dose increase without complaints. She remains on strict intake and output monitoring, with room lockout for 30 minutes following meals to monitor for self-induced emesis. Appetite, sleep, energy, and concentration are stable or improving. No suicidal or homicidal ideation, psychotic symptoms, or significant medical complaints are present. Patient questionably minimizing the  severity of her mental health symptoms. Continues current psychiatric treatment regimen with no changes indicated at this time.  11/11:  Vital signs stable.  No new labs. Patient appears with improving mood, appetite, and concentration. Mild daytime drowsiness and flat affect may be medication-related, and the timing adjustment of Lexapro  and Zyprexa is appropriate. She denies suicidal or homicidal ideation or psychotic symptoms. She remains cooperative, visible on the unit, though social interaction with peers remains limited. Continued observation and coordination with her parents regarding discharge planning are indicated. The plan remains for discharge home under parental care once clinically appropriate.  11/12: Vital signs stable.  No new labs. Patient presents as brighter, more energized, and engaged in treatment. She continues to participate appropriately in groups and unit activities. She is adherent to her medication regimen, with Lexapro  and Zyprexa recently rescheduled to bedtime, which may be contributing to her improved energy and alertness. There are no signs suicidal or homicidal ideation, or psychosis. Overall, the patient demonstrates continued clinical improvement and stability. The plan is to continue the current medication regimen without changes today. Spoke with patient's mother who stated she plans to visit again this evening and will contact the psychiatry team tomorrow morning to provide an update on her observations.     07/03/2024: Family Communication Oscar - Mother [phone]): Spoke with the patient's mother, who reported that she visited yesterday evening.  She stated the visit went well and that the patient appeared to be more upbeat and energetic.  Mother noted some improvement but expressed concern that the patient may be saying the right things primarily to facilitate discharge.  She plans to visit again this evening and will contact the psychiatry team tomorrow  morning to provide an update on  her observations.  Mother expressed appreciation for the clinician's call.   07/02/2024: Collateral Information Oscar - Mother [phone]): With the patient's consent, contact was made with her mother, [name]. She reported that she and the patient's father last visited on Sunday evening and brought the patient some clothes. [name] stated that she spoke with the patient by phone yesterday, and the patient's father spoke with her today. She described the patient as sounding "OK given the circumstances," noting that she sounded better than she did during their first visit on Friday evening.  The patient's mother expressed that while she wants her daughter to return home, her primary concern is ensuring the patient's safety and stability. She shared that the patient's primary care provider had planned to refer her to a therapist and a nutritionist. She reports initial concerns centered around sleep difficulties. [name] stated that she has noticed improvement in the patient's tone and affect, commenting that she "she has pepped up a little bit." She confirmed that the patient has previously taken Lexapro  with benefit and understands that the medication may take some time to reach full effect.  [name] confirmed that the patient lives at home with both parents and is currently unemployed due to anxiety, describing that the patient "was panicky all the time." She expressed that she and the patient's father wish to be involved in discharge planning to ensure appropriate support and structure once the patient returns home. As both parents work, the plan is for the patient's grandparents to stay with her during the day to ensure she is not alone. The patient's mother was appreciative of the update and expressed gratitude for the call.  # MDD (major depressive disorder), recurrent severe, without psychosis (HCC), GAD, OCD - Continue Lexapro  10 mg nightly, MDD, GAD and OCD -  Continue olanzapine 5 mg nightly for severe anxiety, ruminations and unstable mood  # Low BMI, 15.9 - Ordered Strict I&O and monitor for self induced emesis - Ensures, meal supplement twice daily - Lock out room for 30 minutes after meals to prevent purging - TSH 1.1 - Hemoglobin A1c 4.9 - Lipid panel within normal limits - CMP within normal limits - EKG on 06/27/2024: Rate 90, QTc 465, normal sinus rhythm   # GI distress - Continue Protonix  20 mg - Maalox - Milk of magnesia  Risk Assessment -mild  Discharge Planning Barriers to discharge: Mood and eating Estimated length of stay: 7 days Predicted Discharge location: Return home with family    Interval History and update:   - Chart reviewed - Vital signs stable - Ins and outs  continued  11/12: The patient reports that her mood has been good today. She rates her anxiety as 1/10 and her depression as 0/10. She denies suicidal ideation, passive thoughts of death, self-harm urges, homicidal ideation, or psychotic symptoms. The patient reports no difficulty falling or staying asleep last night. Appetite is good; notes she ate a full breakfast consisting of eggs, oatmeal, and hashbrowns, consuming 100% of her meal, along with all of her juice and half of her milk. States she continues to record her intake and output with nursing staff. The patient states her energy level has improved and that she does not feel drowsy today, attributing previous daytime sleepiness to her medication. Concentration is reported as continuing to improve, and she denies any side effects from her current psychiatric medications.  Spoke with the mother who stated she plans to visit again this evening and will contact the psychiatry team tomorrow  morning to provide an update on her observations.     The patient shared that her mother visited yesterday evening, describing the visit as positive.   Review of systems  -Denies denies nausea, abdominal pain,  vomiting, diarrhea, constipation, chest pain, shortness of breath, headache, abnormal movements     Physical Exam MSK/Neuro - Normal gait and station    Mental Status Exam Appearance -disheveled, very thin/malnourished, lacerations bilaterally on the wrist and hematoma on the forehead Attitude - Calm, polite, not guarded Speech - normal volume Mood - Been good Euthymic Affect - Appropriate. Congruent.  Thought Process - LLGD Thought Content - No delusional thoughts expressed SI/HI - Denies  Perceptions - Denies AVH; not RIS Judgement/Insight -adequate Fund of knowledge - WNL Language - No impairments      Lab Results:  Admission on 06/27/2024  Component Date Value Ref Range Status   TSH 06/28/2024 1.130  0.350 - 4.500 uIU/mL Final   Cholesterol 06/28/2024 138  0 - 200 mg/dL Final   Triglycerides 88/92/7974 57  <150 mg/dL Final   HDL 88/92/7974 57  >40 mg/dL Final   Total CHOL/HDL Ratio 06/28/2024 2.4  RATIO Final   VLDL 06/28/2024 11  0 - 40 mg/dL Final   LDL Cholesterol 06/28/2024 70  0 - 99 mg/dL Final   Hgb J8r MFr Bld 06/28/2024 4.9  4.8 - 5.6 % Final   Mean Plasma Glucose 06/28/2024 93.93  mg/dL Final     Vitals: Blood pressure 114/77, pulse 73, temperature 98.5 F (36.9 C), temperature source Oral, resp. rate 18, height 5' 7 (1.702 m), weight 46.3 kg, last menstrual period 06/13/2024, SpO2 100%.    Blair Chiquita Hint, NP  Patient ID: Melissa Santana, female   DOB: 04/29/2001, 23 y.o.   MRN: [id number] Patient ID: Melissa Santana, female   DOB: Oct 27, 2000, 23 y.o.   MRN: [id number] Patient ID: Melissa Santana, female   DOB: 01-03-2001, 23 y.o.   MRN: [id number]

## 2024-07-03 NOTE — BH IP Treatment Plan (Signed)
 Interdisciplinary Treatment and Diagnostic Plan Update  07/03/2024 Time of Session: THIS IS AN UPDATE Melissa Santana MRN: [id number]  Principal Diagnosis: MDD (major depressive disorder), recurrent severe, without psychosis (HCC)  Secondary Diagnoses: Principal Problem:   MDD (major depressive disorder), recurrent severe, without psychosis (HCC) Active Problems:   Generalized anxiety disorder   Suicide attempt (HCC)   OCD (obsessive compulsive disorder)   Current Medications:  Current Facility-Administered Medications  Medication Dose Route Frequency Provider Last Rate Last Admin   acetaminophen (TYLENOL) tablet 650 mg  650 mg Oral Q6H PRN Allen, Tina L, FNP       alum & mag hydroxide-simeth (MAALOX/MYLANTA) 200-200-20 MG/5ML suspension 30 mL  30 mL Oral Q4H PRN Dasie Ellouise CROME, FNP       haloperidol (HALDOL) tablet 5 mg  5 mg Oral TID PRN Allen, Tina L, FNP       And   diphenhydrAMINE (BENADRYL) capsule 50 mg  50 mg Oral TID PRN Allen, Tina L, FNP       haloperidol lactate (HALDOL) injection 5 mg  5 mg Intramuscular TID PRN Allen, Tina L, FNP       And   diphenhydrAMINE (BENADRYL) injection 50 mg  50 mg Intramuscular TID PRN Dasie Ellouise CROME, FNP       And   LORazepam  (ATIVAN ) injection 2 mg  2 mg Intramuscular TID PRN Allen, Tina L, FNP       haloperidol lactate (HALDOL) injection 10 mg  10 mg Intramuscular TID PRN Dasie Ellouise CROME, FNP       And   diphenhydrAMINE (BENADRYL) injection 50 mg  50 mg Intramuscular TID PRN Dasie Ellouise CROME, FNP       And   LORazepam  (ATIVAN ) injection 2 mg  2 mg Intramuscular TID PRN Dasie Ellouise CROME, FNP       escitalopram  (LEXAPRO ) tablet 10 mg  10 mg Oral QHS Bennett, Christal H, NP       feeding supplement (ENSURE PLUS HIGH PROTEIN) liquid 237 mL  237 mL Oral BID BM Pashayan, Alexander S, DO   237 mL at 07/03/24 1023   hydrOXYzine  (ATARAX ) tablet 25 mg  25 mg Oral TID PRN Allen, Tina L, FNP   25 mg at 07/02/24 2059   magnesium hydroxide (MILK OF  MAGNESIA) suspension 30 mL  30 mL Oral Daily PRN Dasie Ellouise CROME, FNP       OLANZapine (ZYPREXA) tablet 5 mg  5 mg Oral QHS Bennett, Christal H, NP       pantoprazole  (PROTONIX ) EC tablet 20 mg  20 mg Oral Daily Dasie Ellouise CROME, FNP   20 mg at 07/03/24 0835   traZODone (DESYREL) tablet 50 mg  50 mg Oral QHS PRN Allen, Tina L, FNP   50 mg at 07/02/24 2059   PTA Medications: Medications Prior to Admission  Medication Sig Dispense Refill Last Dose/Taking   busPIRone  (BUSPAR ) 10 MG tablet Take 0.5 tablets (5 mg total) by mouth 3 (three) times daily. (Patient not taking: Reported on 06/27/2024) 30 tablet 1    desvenlafaxine  (PRISTIQ ) 50 MG 24 hr tablet Take 1 tablet (50 mg total) by mouth daily. 90 tablet 1    dicyclomine (BENTYL) 20 MG tablet Take 1 tablet (20 mg total) by mouth 2 (two) times daily. 20 tablet 0    ondansetron  (ZOFRAN -ODT) 4 MG disintegrating tablet Take 1 tablet (4 mg total) by mouth every 8 (eight) hours as needed for vomiting. 30 tablet 0  pantoprazole  (PROTONIX ) 20 MG tablet Take 1 tablet (20 mg total) by mouth daily. 30 tablet 0    promethazine (PHENERGAN) 25 MG suppository Place 1 suppository (25 mg total) rectally every 6 (six) hours as needed for nausea or vomiting. (Patient not taking: Reported on 06/27/2024) 12 each 0     Patient Stressors: Health problems    Patient Strengths: Manufacturing systems engineer  Supportive family/friends   Treatment Modalities: Medication Management, Group therapy, Case management,  1 to 1 session with clinician, Psychoeducation, Recreational therapy.   Physician Treatment Plan for Primary Diagnosis: MDD (major depressive disorder), recurrent severe, without psychosis (HCC) Long Term Goal(s): Improvement in symptoms so as ready for discharge   Short Term Goals: Ability to identify changes in lifestyle to reduce recurrence of condition will improve Ability to verbalize feelings will improve Ability to disclose and discuss suicidal ideas Ability to  demonstrate self-control will improve Ability to identify and develop effective coping behaviors will improve Ability to maintain clinical measurements within normal limits will improve Compliance with prescribed medications will improve  Medication Management: Evaluate patient's response, side effects, and tolerance of medication regimen.  Therapeutic Interventions: 1 to 1 sessions, Unit Group sessions and Medication administration.  Evaluation of Outcomes: Progressing  Physician Treatment Plan for Secondary Diagnosis: Principal Problem:   MDD (major depressive disorder), recurrent severe, without psychosis (HCC) Active Problems:   Generalized anxiety disorder   Suicide attempt (HCC)   OCD (obsessive compulsive disorder)  Long Term Goal(s): Improvement in symptoms so as ready for discharge   Short Term Goals: Ability to identify changes in lifestyle to reduce recurrence of condition will improve Ability to verbalize feelings will improve Ability to disclose and discuss suicidal ideas Ability to demonstrate self-control will improve Ability to identify and develop effective coping behaviors will improve Ability to maintain clinical measurements within normal limits will improve Compliance with prescribed medications will improve     Medication Management: Evaluate patient's response, side effects, and tolerance of medication regimen.  Therapeutic Interventions: 1 to 1 sessions, Unit Group sessions and Medication administration.  Evaluation of Outcomes: Progressing   RN Treatment Plan for Primary Diagnosis: MDD (major depressive disorder), recurrent severe, without psychosis (HCC) Long Term Goal(s): Knowledge of disease and therapeutic regimen to maintain health will improve  Short Term Goals: Ability to disclose and discuss suicidal ideas and Ability to identify and develop effective coping behaviors will improve  Medication Management: RN will administer medications as  ordered by provider, will assess and evaluate patient's response and provide education to patient for prescribed medication. RN will report any adverse and/or side effects to prescribing provider.  Therapeutic Interventions: 1 on 1 counseling sessions, Psychoeducation, Medication administration, Evaluate responses to treatment, Monitor vital signs and CBGs as ordered, Perform/monitor CIWA, COWS, AIMS and Fall Risk screenings as ordered, Perform wound care treatments as ordered.  Evaluation of Outcomes: Progressing   LCSW Treatment Plan for Primary Diagnosis: MDD (major depressive disorder), recurrent severe, without psychosis (HCC) Long Term Goal(s): Safe transition to appropriate next level of care at discharge, Engage patient in therapeutic group addressing interpersonal concerns.  Short Term Goals: Engage patient in aftercare planning with referrals and resources, Increase social support, and Increase skills for wellness and recovery  Therapeutic Interventions: Assess for all discharge needs, 1 to 1 time with Social worker, Explore available resources and support systems, Assess for adequacy in community support network, Educate family and significant other(s) on suicide prevention, Complete Psychosocial Assessment, Interpersonal group therapy.  Evaluation of Outcomes: Progressing  Progress in Treatment: Attending groups: Yes. Participating in groups: Yes. Taking medication as prescribed: Yes. Toleration medication: Yes. Family/Significant other contact made: Yes, contacted:  [name], mom & [name], dad, [phone] Patient understands diagnosis: Yes. Discussing patient identified problems/goals with staff: Yes. Medical problems stabilized or resolved: Yes. Denies suicidal/homicidal ideation: Yes. Issues/concerns per patient self-inventory: No.   New problem(s) identified:  No   New Short Term/Long Term Goal(s):     medication stabilization, elimination of SI  thoughts, development of comprehensive mental wellness plan.      Patient Goals:  I want to work on my mental health and managing anxiety.   Discharge Plan or Barriers:  Patient recently admitted. CSW will continue to follow and assess for appropriate referrals and possible discharge planning.    Reason for Continuation of Hospitalization: Anxiety Depression Medical Issues Medication stabilization   Estimated Length of Stay:  1-2 days  Last 3 [place] Suicide Severity Risk Score: Flowsheet Row [facility] (Current[facility] Most recent reading at 06/27/2024  1:00 PM ED from 06/27/2024 in [facility] Most recent reading at 06/27/2024  6:29 AM ED from 06/23/2024 in [facility] Department at [facility] Most recent reading at 06/23/2024  8:51 PM  C-SSRS RISK CATEGORY High Risk High Risk No Risk    Last PHQ 2/9 Scores:    06/25/2024    2:35 PM 12/30/2020    4:02 PM 11/12/2020    2:45 PM  Depression screen PHQ 2/9  Decreased Interest 3 2 3   Down, Depressed, Hopeless 2 2 1   PHQ - 2 Score 5 4 4   Altered sleeping 2 2 3   Tired, decreased energy 1 2 3   Change in appetite 1 3 3   Feeling bad or failure about yourself  2 1 1   Trouble concentrating 2 1 1   Moving slowly or fidgety/restless 2 1 2   Suicidal thoughts 0 0 0  PHQ-9 Score 15  14  17    Difficult doing work/chores  Not difficult at all Very difficult     Data saved with a previous flowsheet row definition    Scribe for Treatment Team: Jenkins LULLA Primer, LCSWA 07/03/2024 1:30 PM

## 2024-07-03 NOTE — Group Note (Signed)
 Date:  07/03/2024 Time:  10:26 AM  Group Topic/Focus:  Goals Group:   The focus of this group is to help patients establish daily goals to achieve during treatment and discuss how the patient can incorporate goal setting into their daily lives to aide in recovery. Orientation:   The focus of this group is to educate the patient on the purpose and policies of crisis stabilization and provide a format to answer questions about their admission.  The group details unit policies and expectations of patients while admitted.    Participation Level:  Did Not Attend  Melissa Santana 07/03/2024, 10:26 AM

## 2024-07-03 NOTE — BH Assessment (Signed)
(  Sleep Hours) - 7.75 (Any PRNs that were needed, meds refused, or side effects to meds)-  (Any disturbances and when (visitation, over night)- None (Concerns raised by the patient)- None (SI/HI/AVH)- Denies

## 2024-07-03 NOTE — Group Note (Signed)
 Date:  07/03/2024 Time:  4:50 PM  Group Topic/Focus: 5 Love languages Healthy Communication:   The focus of this group is to discuss communication, barriers to communication, as well as healthy ways to communicate with others.    Participation Level:  Active  Participation Quality:  Appropriate  Affect:  Appropriate  Cognitive:  Appropriate  Insight: Appropriate  Engagement in Group:  Engaged  Modes of Intervention:  Discussion and Education  Additional Comments:    Melissa Santana 07/03/2024, 4:50 PM

## 2024-07-03 NOTE — Group Note (Signed)
 Date:  07/03/2024 Time:  11:15 AM  Group Topic/Focus:   Recreational Therapy: The focus of this group was to discuss leisure with a hands on activity meant to help patients explore their physical, emotional, cognitive and/or social wellbeing.  Pt did attend recreational therapy group   Jermey Closs D Avangeline Stockburger 07/03/2024, 11:15 AM

## 2024-07-03 NOTE — Plan of Care (Signed)
   Problem: Education: Goal: Knowledge of risk factors and measures for prevention of condition will improve Outcome: Progressing

## 2024-07-03 NOTE — Group Note (Signed)
 Date:  07/03/2024 Time:  12:57 PM  Group Topic/Focus:  Pharmacy Group    Pt did attend pharmacy group   Melissa Santana [name] 07/03/2024, 12:57 PM

## 2024-07-03 NOTE — Group Note (Signed)
 Date:  07/03/2024 Time:  4:19 PM  Group Topic/Focus: Spiritual Wellness (By Elia)   Pt did attend spiritual wellness group  Melissa Santana D [name] 07/03/2024, 4:19 PM

## 2024-07-03 NOTE — Plan of Care (Signed)
   Problem: Education: Goal: Knowledge of risk factors and measures for prevention of condition will improve Outcome: Progressing   Problem: Coping: Goal: Psychosocial and spiritual needs will be supported Outcome: Progressing

## 2024-07-03 NOTE — Progress Notes (Signed)
 Spiritual care group facilitated by Chaplain Rockie Sofia, BCC and Alan Lesches, Mdiv  Group focused on topic of strength. Group members reflected on what thoughts and feelings emerge when they hear this topic. They then engaged in facilitated dialog around how strength is present in their lives. This dialog focused on representing what strength had been to them in their lives (images and patterns given) and what they saw as helpful in their life now (what they needed / wanted).  Activity drew on narrative framework.  Patient Progress: Melissa Santana attended group.  Though verbal participation was minimal, she demonstrated engagement in the group conversation.

## 2024-07-04 MED ORDER — ESCITALOPRAM OXALATE 10 MG PO TABS: 10.0000 mg | ORAL_TABLET | Freq: Every day | ORAL | 0 refills | Status: DC

## 2024-07-04 MED ORDER — TRAZODONE HCL 50 MG PO TABS: 50.0000 mg | ORAL_TABLET | Freq: Every evening | ORAL | 0 refills | Status: DC | PRN

## 2024-07-04 MED ORDER — OLANZAPINE 5 MG PO TABS: 5.0000 mg | ORAL_TABLET | Freq: Every day | ORAL | 0 refills | Status: DC

## 2024-07-04 MED ORDER — HYDROXYZINE HCL 25 MG PO TABS: 25.0000 mg | ORAL_TABLET | Freq: Three times a day (TID) | ORAL | 0 refills | Status: DC | PRN

## 2024-07-04 NOTE — Progress Notes (Signed)
 Patient denies SI, HI, AVH. Patient stated they slept Fair last night. Scores 1/10 on anxiety and zero on depression. Patient goal is Discharge plan and states will Go to groups and meet with my nurse to meet that goal. Patient has been overall calm, cooperative, and med compliant.      07/04/24 1000  Psych Admission Type (Psych Patients Only)  Admission Status Voluntary  Psychosocial Assessment  Patient Complaints None  Eye Contact Fair  Facial Expression Flat  Affect Appropriate to circumstance  Speech Logical/coherent  Interaction Guarded  Motor Activity Other (Comment) (WDL)  Appearance/Hygiene Unremarkable  Behavior Characteristics Cooperative;Appropriate to situation;Guarded  Mood Pleasant  Thought Process  Coherency WDL  Content WDL  Delusions None reported or observed  Perception WDL  Hallucination None reported or observed  Judgment Impaired  Confusion None  Danger to Self  Current suicidal ideation? Denies  Agreement Not to Harm Self Yes  Description of Agreement Verbal  Danger to Others  Danger to Others None reported or observed

## 2024-07-04 NOTE — Plan of Care (Signed)
  Problem: Education: Goal: Knowledge of risk factors and measures for prevention of condition will improve Outcome: Progressing   Problem: Coping: Goal: Psychosocial and spiritual needs will be supported Outcome: Progressing   Problem: Respiratory: Goal: Will maintain a patent airway Outcome: Progressing Goal: Complications related to the disease process, condition or treatment will be avoided or minimized Outcome: Progressing   Problem: Education: Goal: Knowledge of [place] [facility] Education information/materials will improve Outcome: Progressing Goal: Emotional status will improve Outcome: Progressing Goal: Mental status will improve Outcome: Progressing Goal: Verbalization of understanding the information provided will improve Outcome: Progressing

## 2024-07-04 NOTE — Progress Notes (Addendum)
  [facility] Adult Case Management Discharge Plan :  Will you be returning to the same living situation after discharge:  Yes,  patient will be returning home to address on file.  At discharge, do you have transportation home?: No. CSW arranged taxi voucher through [employer] at 10 am to patient's grandmothers house. Melissa Santana located at [address] , [place] and confirmed the grandmother is home for patient to have access to the house. Patient's mother, Melissa Santana, will be providing transportation back to their home after work.    Do you have the ability to pay for your medications: No. Pharmacy provided samples for patient and CSW provided Medicaid application for patient.   Release of information consent forms completed and in the chart;  Patient's signature needed at discharge.  Patient to Follow up at:  Follow-up Information     [facility], [facility]. Go on 07/05/2024.   Why: Please go to this provider on 07/05/24 at 9:00 am for a hospital follow up assessment, to obtain therapy and medication management services. Contact information: [address] [place] [address] [phone]                 Next level of care provider has access to [facility] Link:no  Safety Planning and Suicide Prevention discussed: Yes,  completed with Melissa Santana, mom & Melissa Santana, dad, [phone].      Has patient been referred to the Quitline?: Patient does not use tobacco/nicotine products  Patient has been referred for addiction treatment: No known substance use disorder.  Melissa Santana, LCSWA 07/04/2024, 9:38 AM

## 2024-07-04 NOTE — Group Note (Signed)
 Date:  07/04/2024 Time:  9:40 AM  Group Topic/Focus:  Goals Group:   The focus of this group is to help patients establish daily goals to achieve during treatment and discuss how the patient can incorporate goal setting into their daily lives to aide in recovery.    Participation Level:  None  Participation Quality:  Attentive  Affect:  Appropriate  Cognitive:  Alert  Insight: None  Engagement in Group:  None  Modes of Intervention:  Discussion  Additional Comments:  Patient attend group but did not participate.  Rosaleen BIRCH Desa Rech 07/04/2024, 9:40 AM

## 2024-07-04 NOTE — BHH Suicide Risk Assessment (Signed)
 [facility] Discharge Suicide Risk Assessment   Principal Problem: MDD (major depressive disorder), recurrent severe, without psychosis (HCC) Discharge Diagnoses: Principal Problem:   MDD (major depressive disorder), recurrent severe, without psychosis (HCC) Active Problems:   Generalized anxiety disorder   Suicide attempt (HCC)   OCD (obsessive compulsive disorder)   Demographic Factors:  Adolescent or young adult and Caucasian  Loss Factors: NA  Historical Factors: Prior suicide attempts  Risk Reduction Factors:   Positive social support  Continued Clinical Symptoms:  Depression; OCD; GAD  Cognitive Features That Contribute To Risk:  None    Suicide Risk:  Mild:  Suicidal ideation of limited frequency, intensity, duration, and specificity.  There are no identifiable plans, no associated intent, mild dysphoria and related symptoms, good self-control (both objective and subjective assessment), few other risk factors, and identifiable protective factors, including available and accessible social support.   Follow-up Information     [facility]. Go on 07/05/2024.   Why: Please go to this provider on 07/05/24 at 9:00 am for a hospital follow up assessment, to obtain therapy and medication management services. Contact information: [address] [place] [address] [phone]                  Oliva DELENA Salmon, DO 07/04/2024, 10:01 AM

## 2024-07-04 NOTE — Plan of Care (Signed)
   Problem: Education: Goal: Emotional status will improve Outcome: Progressing Goal: Mental status will improve Outcome: Progressing Goal: Verbalization of understanding the information provided will improve Outcome: Progressing

## 2024-07-04 NOTE — Progress Notes (Addendum)
(  Sleep Hours) -8.25 (Any PRNs that were needed, meds refused, or side effects to meds)- trazodone and Hydroxyzine  (Any disturbances and when (visitation, over night)-none (Concerns raised by the patient)- none (SI/HI/AVH)-denied

## 2024-07-04 NOTE — Progress Notes (Signed)
 Pt discharged to lobby. Pt was stable and appreciative at that time. All papers and prescriptions were given and valuables returned. Verbal understanding expressed. Denies SI/HI and A/VH. Pt given opportunity to express concerns and ask questions.

## 2024-07-04 NOTE — Transportation (Signed)
 07/04/2024  Melissa Santana DOB: Oct 19, 2000 MRN: [id number]   RIDER WAIVER AND RELEASE OF LIABILITY  For the purposes of helping with transportation needs, [facility] partners with outside transportation providers (taxi companies, [place], catering manager.) to give [facility] patients or other approved people the choice of on-demand rides Public Librarian) to our buildings for non-emergency visits.  By using [employer], I, the person signing this document, on behalf of myself and/or any legal minors (in my care using the [employer]), agree:  Science Writer given to me are supplied by independent, outside transportation providers who do not work for, or have any affiliation with, [employer]. [place] is not a transportation company. [place] has no control over the quality or safety of the rides I get using [employer]. [place] has no control over whether any outside ride will happen on time or not. [place] gives no guarantee on the reliability, quality, safety, or availability on any rides, or that no mistakes will happen. I know and accept that traveling by vehicle (car, truck, SVU, fleeta, bus, taxi, etc.) has risks of serious injuries such as disability, being paralyzed, and death. I know and agree the risk of using [employer] is mine alone, and not [employer]. [employer] are provided as is and as are available. The transportation providers are in charge for all inspections and care of the vehicles used to provide these rides. I agree not to take legal action against [employer], its agents, employees, officers, directors, representatives, insurers, attorneys, assigns, successors, subsidiaries, and affiliates at any time for any reasons related directly or indirectly to using [employer]. I also agree not to take legal action against [place] or its affiliates for any injury, death, or damage to property caused by or related to  using [employer]. I have read this Waiver and Release of Liability, and I understand the terms used in it and their legal meaning. This Waiver is freely and voluntarily given with the understanding that my right (or any legal minors) to legal action against [place] relating to [employer] is knowingly given up to use these services.   I attest that I read the Ride Waiver and Release of Liability to Melissa Santana, gave Ms. Kierstead the opportunity to ask questions and answered the questions asked (if any). I affirm that Melissa Santana then provided consent for assistance with transportation.

## 2024-07-04 NOTE — Plan of Care (Deleted)
  Problem: Education: Goal: Knowledge of risk factors and measures for prevention of condition will improve 07/04/2024 1035 by Shireen Mercie DEL, RN Outcome: Progressing 07/04/2024 0812 by Shireen Mercie DEL, RN Outcome: Progressing   Problem: Coping: Goal: Psychosocial and spiritual needs will be supported 07/04/2024 1035 by Shireen Mercie DEL, RN Outcome: Progressing 07/04/2024 0812 by Shireen Mercie DEL, RN Outcome: Progressing   Problem: Respiratory: Goal: Will maintain a patent airway 07/04/2024 1035 by Shireen Mercie DEL, RN Outcome: Progressing 07/04/2024 0812 by Shireen Mercie DEL, RN Outcome: Progressing Goal: Complications related to the disease process, condition or treatment will be avoided or minimized 07/04/2024 1035 by Shireen Mercie DEL, RN Outcome: Progressing 07/04/2024 0812 by Shireen Mercie DEL, RN Outcome: Progressing   Problem: Education: Goal: Knowledge of [facility] Education information/materials will improve Outcome: Progressing Goal: Emotional status will improve Outcome: Progressing Goal: Mental status will improve Outcome: Progressing Goal: Verbalization of understanding the information provided will improve Outcome: Progressing

## 2024-07-08 NOTE — Discharge Summary (Signed)
 Physician Discharge Summary Note  Patient:  Melissa Santana is an 22 y.o., female MRN:  [id number] DOB:  06-Oct-2000 Patient phone:  [phone] (home)  Patient address:   [address] [place] [address],  Total Time spent with patient: 45 minutes  Date of Admission:  06/27/2024 Date of Discharge: 07/04/2024  Reason for Admission:  Melissa Santana was admitted following a suicide attempt by bilateral wrist lacerations in the context of severe, recurrent major depressive disorder, generalized anxiety disorder, and obsessive-compulsive disorder. The attempt was precipitated by several months of worsening mood, severe anxiety, and obsessive thoughts related to ongoing GI distress, with significant associated weight loss (?20 lbs over several weeks).    Principal Problem: MDD (major depressive disorder), recurrent severe, without psychosis (HCC) Discharge Diagnoses: Principal Problem:   MDD (major depressive disorder), recurrent severe, without psychosis (HCC) Active Problems:   Generalized anxiety disorder   OCD (obsessive compulsive disorder)   Past Psychiatric History:  4 suicide attempts -No prior psychiatric hospitalizations -Previous psychotherapy -Previous trial of Lexapro  with good benefit.  Reports no benefit from Wellbutrin  or Zoloft  in the past.  Past Medical History:  Past Medical History:  Diagnosis Date   Anemia    Depression    GAD (generalized anxiety disorder)    Menstrual extraction 06/12/2013   History reviewed. No pertinent surgical history.   Hospital Course:  On admission the patient presented as malnourished, with a BMI of 15.9, and reported a 20-pound weight loss over several weeks due to significant GI distress, including abdominal pain, nausea, and reflux. She attributed her food restriction to fear of exacerbating these symptoms, rather than concerns about weight or body image. She had previously responded well to escitalopram  but  had been off medication for over a year prior to admission.   She was started on escitalopram  10 mg nightly and olanzapine 5 mg nightly. Pantoprazole  was started for her reported GI symptoms, and nutritional supplementation with Ensure was provided twice daily. Strict intake and output monitoring was implemented, and she was placed on a 30 minute room lockout after meals to monitor for self-induced emesis, though she consistently denied any purging behaviors.  Throughout her stay she gradually demonstrated improvements in mood, energy, and engagement with treatment. She became more interactive in group activities and reported better sleep and appetite. She remained free of suicidal ideation. She tolerated her medication regimen well, with no significant side effects reported.  Both parents were actively engaged in discharge planning, and arrangements were made for her grandparents to supervise her during the day after discharge.    Musculoskeletal: Normal gait and station  Mental Status Exam: Appearance - Casually dressed, appropriate hygiene and grooming, thin Eye-Contact - Normal Attitude - Calm, polite, not guarded Speech - normal volume, prosody, inflection Mood - Pretty good Affect - Mildly restricted Thought Process - LLGD Thought Content - No delusional TC expressed SI/HI - Denies  Perceptions - Denies AVH; not RIS Judgement/Insight - Good  Fund of knowledge - WNL Language - No impairments    Physical Exam Vitals reviewed.  Constitutional:      Appearance: Normal appearance.  HENT:     Head: Normocephalic and atraumatic.  Pulmonary:     Effort: Pulmonary effort is normal.  Neurological:     General: No focal deficit present.     Mental Status: She is alert and oriented to person, place, and time.    Review of Systems  Constitutional: Negative.   Eyes: Negative.   Cardiovascular:  Negative.    Blood pressure 104/70, pulse 67, temperature 98.7 F (37.1 C),  temperature source Oral, resp. rate 18, height 5' 7 (1.702 m), weight 46.3 kg, last menstrual period 06/13/2024, SpO2 99%. Body mass index is 15.98 kg/m.   Social History   Tobacco Use  Smoking Status Never  Smokeless Tobacco Never   Tobacco Cessation:  N/A, patient does not currently use tobacco products   Blood Alcohol level:  Lab Results  Component Value Date   [facility] <15 06/27/2024    Metabolic Disorder Labs:  Lab Results  Component Value Date   HGBA1C 4.9 06/28/2024   MPG 93.93 06/28/2024   No results found for: PROLACTIN Lab Results  Component Value Date   CHOL 138 06/28/2024   TRIG 57 06/28/2024   HDL 57 06/28/2024   CHOLHDL 2.4 06/28/2024   VLDL 11 06/28/2024   LDLCALC 70 06/28/2024    See Psychiatric Specialty Exam and Suicide Risk Assessment completed by Attending Physician prior to discharge.  Discharge destination:  Home  Is patient on multiple antipsychotic therapies at discharge:  No    Allergies as of 07/04/2024   No Known Allergies      Medication List     STOP taking these medications    busPIRone  10 MG tablet Commonly known as: BUSPAR    desvenlafaxine  50 MG 24 hr tablet Commonly known as: Pristiq    dicyclomine 20 MG tablet Commonly known as: BENTYL   promethazine 25 MG suppository Commonly known as: PHENERGAN       TAKE these medications      Indication  escitalopram  10 MG tablet Commonly known as: LEXAPRO  Take 1 tablet (10 mg total) by mouth at bedtime.  Indication: Major Depressive Disorder   hydrOXYzine  25 MG tablet Commonly known as: ATARAX  Take 1 tablet (25 mg total) by mouth 3 (three) times daily as needed for anxiety.  Indication: Feeling Anxious   OLANZapine 5 MG tablet Commonly known as: ZYPREXA Take 1 tablet (5 mg total) by mouth at bedtime.  Indication: Major Depressive Disorder   ondansetron  4 MG disintegrating tablet Commonly known as: ZOFRAN -ODT Take 1 tablet (4 mg total) by mouth every 8 (eight)  hours as needed for vomiting.  Indication: Nausea and Vomiting   pantoprazole  20 MG tablet Commonly known as: PROTONIX  Take 1 tablet (20 mg total) by mouth daily.  Indication: Gastroesophageal Reflux Disease   traZODone 50 MG tablet Commonly known as: DESYREL Take 1 tablet (50 mg total) by mouth at bedtime as needed for sleep.  Indication: Trouble Sleeping        Follow-up Information     [facility]. Go on 07/05/2024.   Why: Please go to this provider on 07/05/24 at 9:00 am for a hospital follow up assessment, to obtain therapy and medication management services. Contact information: [address] [place] [address] [phone]                 Follow-up recommendations:  Activity:  As tolerated Diet:  regular    Signed: Oliva DELENA Salmon, DO 07/08/2024, 1:24 PM

## 2024-07-08 NOTE — Progress Notes (Signed)
 Spiritual care group on grief and loss facilitated by Chaplain Rockie Sofia, Bcc  Group Goal: Support / Education around grief and loss  Members engage in facilitated group support and psycho-social education.  Group Description:  Following introductions and group rules, group members engaged in facilitated group dialogue and support around topic of loss, with particular support around experiences of loss in their lives. Group Identified types of loss (relationships / self / things) and identified patterns, circumstances, and changes that precipitate losses. Reflected on thoughts / feelings around loss, normalized grief responses, and recognized variety in grief experience. Group encouraged individual reflection on safe space and on the coping skills that they are already utilizing.  Group drew on Adlerian / Rogerian and narrative framework  Patient Progress: Melissa Santana attended group and actively engaged and participated in group conversation and activities.

## 2024-07-09 ENCOUNTER — Encounter: Payer: Self-pay | Admitting: Family Medicine

## 2024-07-09 ENCOUNTER — Ambulatory Visit: Payer: Self-pay | Admitting: Family Medicine

## 2024-07-09 VITALS — BP 101/68 | HR 102 | Temp 98.6°F | Ht 67.0 in | Wt 109.0 lb

## 2024-07-09 DIAGNOSIS — R636 Underweight: Secondary | ICD-10-CM

## 2024-07-09 DIAGNOSIS — Z9151 Personal history of suicidal behavior: Secondary | ICD-10-CM

## 2024-07-09 DIAGNOSIS — F419 Anxiety disorder, unspecified: Secondary | ICD-10-CM

## 2024-07-09 DIAGNOSIS — F332 Major depressive disorder, recurrent severe without psychotic features: Secondary | ICD-10-CM

## 2024-07-09 DIAGNOSIS — Z681 Body mass index (BMI) 19 or less, adult: Secondary | ICD-10-CM

## 2024-07-09 NOTE — Assessment & Plan Note (Signed)
 Severe. Advised continued use of Zyprexa and Lexapro .  Discussed making plans for the future. Information given regarding [facility]. Referring to social work for [employer] and to aid in museum/gallery curator.

## 2024-07-09 NOTE — Progress Notes (Signed)
 Subjective:  Patient ID: Melissa Santana, female    DOB: 05-27-2001  Age: 23 y.o. MRN: [id number]  CC:   Chief Complaint  Patient presents with   2 week follow up for depression medication     Discuss if its it ok to take citalopram with olanzapine    HPI:  23 year old female presents for follow up.  Recent BH admission after suicide attempt. She is now on Lexapro ,Zyprexa, Hydroxyzine  and Trazodone.  She is concerned about the medications being right for her. Concerned about side effects. States that they make her feel drowsy, so she is taking them at night.  Patient states that she is overwhelmed by everything. Has difficulty giving additional detail. States that she got overwhelmed and anxious, which led to the suicide attempt. Still very guarded and avoids eye contact.  Doesn't give a lot of history. Mother providing history mostly.   Feels that lack of insurance is a significant barrier. Will discuss referral to social work.  Patient Active Problem List   Diagnosis Date Noted   History of suicide attempt 07/09/2024   OCD (obsessive compulsive disorder) 06/28/2024   MDD (major depressive disorder), recurrent severe, without psychosis (HCC) 06/27/2024   Severe anxiety 06/25/2024   Underweight (BMI < 18.5) 06/25/2024    Social Hx   Social History   Socioeconomic History   Marital status: Single    Spouse name: Not on file   Number of children: Not on file   Years of education: Not on file   Highest education level: Not on file  Occupational History   Not on file  Tobacco Use   Smoking status: Never   Smokeless tobacco: Never  Vaping Use   Vaping status: Never Used  Substance and Sexual Activity   Alcohol use: No   Drug use: No   Sexual activity: Not Currently    Birth control/protection: None, Abstinence  Other Topics Concern   Not on file  Social History Narrative   Not on file   Social Drivers of Health   Financial Resource Strain: High Risk  (11/05/2020)   Overall Financial Resource Strain (CARDIA)    Difficulty of Paying Living Expenses: Hard  Food Insecurity: No Food Insecurity (06/27/2024)   Hunger Vital Sign    Worried About Running Out of Food in the Last Year: Never true    Ran Out of Food in the Last Year: Never true  Transportation Needs: No Transportation Needs (06/27/2024)   PRAPARE - Administrator, Civil Service (Medical): No    Lack of Transportation (Non-Medical): No  Physical Activity: Sufficiently Active (11/05/2020)   Exercise Vital Sign    Days of Exercise per Week: 6 days    Minutes of Exercise per Session: 60 min  Stress: Stress Concern Present (11/05/2020)   [employer] of Occupational Health - Occupational Stress Questionnaire    Feeling of Stress : Very much  Social Connections: Socially Isolated (11/05/2020)   Social Connection and Isolation Panel    Frequency of Communication with Friends and Family: Three times a week    Frequency of Social Gatherings with Friends and Family: Twice a week    Attends Religious Services: Never    Database Administrator or Organizations: No    Attends Engineer, Structural: Never    Marital Status: Never married    Review of Systems Per HPI  Objective:  BP 101/68   Pulse (!) 102   Temp 98.6 F (37  C)   Ht 5' 7 (1.702 m)   Wt 109 lb (49.4 kg)   LMP 06/13/2024 (Exact Date)   SpO2 100%   BMI 17.07 kg/m      07/09/2024    2:50 PM 07/04/2024    6:08 AM 07/03/2024    4:44 PM  BP/Weight  Systolic BP 101 104 111  Diastolic BP 68 70 68  Wt. (Lbs) 109    BMI 17.07 kg/m2      Physical Exam Vitals and nursing note reviewed.  Constitutional:      General: She is not in acute distress.    Comments: Underweight.  Pulmonary:     Effort: Pulmonary effort is normal. No respiratory distress.  Psychiatric:     Comments: Flat affect. Depressed mood. Minimal eye contact.  ? Poor insight      Lab Results  Component Value Date    WBC 8.9 06/27/2024   HGB 11.0 (L) 06/27/2024   HCT 33.9 (L) 06/27/2024   PLT 213 06/27/2024   GLUCOSE 93 06/27/2024   CHOL 138 06/28/2024   TRIG 57 06/28/2024   HDL 57 06/28/2024   LDLCALC 70 06/28/2024   ALT 18 06/27/2024   AST 21 06/27/2024   NA 142 06/27/2024   K 3.5 06/27/2024   CL 104 06/27/2024   CREATININE 0.57 06/27/2024   BUN 12 06/27/2024   CO2 26 06/27/2024   TSH 1.130 06/28/2024   INR 1.1 06/23/2024   HGBA1C 4.9 06/28/2024     Assessment & Plan:  MDD (major depressive disorder), recurrent severe, without psychosis (HCC) Assessment & Plan: Severe. Advised continued use of Zyprexa and Lexapro .  Discussed making plans for the future. Information given regarding [facility]. Referring to social work for [employer] and to aid in museum/gallery curator.  Orders: -     Ambulatory referral to Social Work  Severe anxiety -     Ambulatory referral to Social Work  Underweight (BMI < 18.5) -     Ambulatory referral to Social Work  History of suicide attempt   Follow-up:  6 weeks  30 minutes spent during this visit (reviewing hospital records, discussing current issues with mother and patient, discussing referral to social work, counseling, plan of care).  Jacqulyn Ahle DO [facility]

## 2024-07-09 NOTE — Patient Instructions (Signed)
 Referral placed.  Try [employer] in [place].  Follow up in 6 weeks.

## 2024-07-11 ENCOUNTER — Telehealth: Payer: Self-pay

## 2024-07-11 NOTE — Progress Notes (Signed)
 Complex Care Management Note Care Guide Note  07/11/2024 Name: Melissa Santana MRN: [id number] DOB: [birth year]   Complex Care Management Outreach Attempts: An unsuccessful telephone outreach was attempted today to offer the patient information about available complex care management services.  Follow Up Plan:  Additional outreach attempts will be made to offer the patient complex care management information and services.   Encounter Outcome:  No Answer  Jeoffrey Buffalo , RMA     [place]  [facility], [facility] Guide  Direct Dial: [phone]  Website: [email or web address]

## 2024-07-30 NOTE — Progress Notes (Unsigned)
 Complex Care Management Note Care Guide Note  07/30/2024 Name: Melissa Santana MRN: [id number] DOB: 04-27-2001   Complex Care Management Outreach Attempts: A second unsuccessful outreach was attempted today to offer the patient with information about available complex care management services.  Follow Up Plan:  Additional outreach attempts will be made to offer the patient complex care management information and services.   Encounter Outcome:  No Answer  Jeoffrey Buffalo , RMA     [place]  [facility], [facility] Guide  Direct Dial: [phone]  Website: [email or web address]

## 2024-08-01 ENCOUNTER — Telehealth: Payer: Self-pay | Admitting: Family Medicine

## 2024-08-01 ENCOUNTER — Other Ambulatory Visit: Payer: Self-pay | Admitting: Family Medicine

## 2024-08-01 MED ORDER — HYDROXYZINE HCL 25 MG PO TABS: 25.0000 mg | ORAL_TABLET | Freq: Three times a day (TID) | ORAL | 3 refills | Status: AC | PRN

## 2024-08-01 MED ORDER — ESCITALOPRAM OXALATE 10 MG PO TABS: 10.0000 mg | ORAL_TABLET | Freq: Every day | ORAL | 3 refills | Status: AC

## 2024-08-01 MED ORDER — TRAZODONE HCL 50 MG PO TABS: 50.0000 mg | ORAL_TABLET | Freq: Every evening | ORAL | 3 refills | Status: AC | PRN

## 2024-08-01 MED ORDER — OLANZAPINE 5 MG PO TABS: 5.0000 mg | ORAL_TABLET | Freq: Every day | ORAL | 3 refills | Status: AC

## 2024-08-01 NOTE — Telephone Encounter (Signed)
 Prescription  Escitalopram  10mg   filled by different provider last filled 07/14/24

## 2024-08-01 NOTE — Telephone Encounter (Signed)
 Prescription for OLANZapine  (ZYPREXA ) 5 MG tablet   Prescribe by different provider last filled 07/04/24  [facility]

## 2024-08-01 NOTE — Progress Notes (Signed)
 Complex Care Management Note Care Guide Note  08/01/2024 Name: Melissa Santana MRN: [id number] DOB: [birth year]   Complex Care Management Outreach Attempts: A third unsuccessful outreach was attempted today to offer the patient with information about available complex care management services.  Follow Up Plan:  No further outreach attempts will be made at this time. We have been unable to contact the patient to offer or enroll patient in complex care management services.  Encounter Outcome:  No Answer  Jeoffrey Buffalo , RMA     [place]  [facility], [facility] Guide  Direct Dial: [phone]  Website: [email or web address]

## 2024-08-20 ENCOUNTER — Ambulatory Visit: Payer: Self-pay | Admitting: Family Medicine

## 2024-08-28 ENCOUNTER — Ambulatory Visit: Payer: Self-pay | Admitting: Family Medicine
